# Patient Record
Sex: Female | Born: 1975 | Race: Black or African American | Hispanic: No | Marital: Married | Smoking: Former smoker
Health system: Southern US, Community
[De-identification: ages and names within clinical notes are randomized; demographics above are authoritative.]

## PROBLEM LIST (undated history)

## (undated) DIAGNOSIS — E669 Obesity, unspecified: Secondary | ICD-10-CM

## (undated) DIAGNOSIS — E079 Disorder of thyroid, unspecified: Secondary | ICD-10-CM

## (undated) DIAGNOSIS — I10 Essential (primary) hypertension: Secondary | ICD-10-CM

## (undated) DIAGNOSIS — E785 Hyperlipidemia, unspecified: Secondary | ICD-10-CM

## (undated) DIAGNOSIS — IMO0001 Reserved for inherently not codable concepts without codable children: Secondary | ICD-10-CM

## (undated) DIAGNOSIS — M1990 Unspecified osteoarthritis, unspecified site: Secondary | ICD-10-CM

## (undated) HISTORY — DX: Disorder of thyroid, unspecified: E07.9

## (undated) HISTORY — PX: REDUCTION MAMMAPLASTY: SUR839

## (undated) HISTORY — DX: Obesity, unspecified: E66.9

## (undated) HISTORY — DX: Reserved for inherently not codable concepts without codable children: IMO0001

## (undated) HISTORY — DX: Essential (primary) hypertension: I10

## (undated) HISTORY — DX: Unspecified osteoarthritis, unspecified site: M19.90

## (undated) HISTORY — DX: Hyperlipidemia, unspecified: E78.5

## (undated) HISTORY — PX: COSMETIC SURGERY: SHX468

---

## 2001-08-06 ENCOUNTER — Ambulatory Visit (HOSPITAL_BASED_OUTPATIENT_CLINIC_OR_DEPARTMENT_OTHER): Admission: RE | Admit: 2001-08-06 | Discharge: 2001-08-06 | Payer: Self-pay | Admitting: Plastic Surgery

## 2005-11-20 ENCOUNTER — Emergency Department: Payer: Self-pay | Admitting: Emergency Medicine

## 2005-11-20 ENCOUNTER — Other Ambulatory Visit: Payer: Self-pay

## 2006-11-26 ENCOUNTER — Ambulatory Visit: Payer: Self-pay | Admitting: Obstetrics and Gynecology

## 2007-09-06 ENCOUNTER — Ambulatory Visit: Payer: Self-pay | Admitting: Obstetrics and Gynecology

## 2008-06-18 ENCOUNTER — Ambulatory Visit: Payer: Self-pay

## 2008-12-05 ENCOUNTER — Emergency Department: Payer: Self-pay | Admitting: Emergency Medicine

## 2009-01-27 ENCOUNTER — Ambulatory Visit: Payer: Self-pay | Admitting: Family Medicine

## 2015-08-16 ENCOUNTER — Encounter: Payer: Self-pay | Admitting: Family Medicine

## 2015-08-16 ENCOUNTER — Ambulatory Visit (INDEPENDENT_AMBULATORY_CARE_PROVIDER_SITE_OTHER): Payer: 59 | Admitting: Family Medicine

## 2015-08-16 VITALS — BP 126/82 | HR 95 | Temp 99.0°F | Resp 18 | Ht 63.0 in | Wt 206.9 lb

## 2015-08-16 DIAGNOSIS — M79603 Pain in arm, unspecified: Secondary | ICD-10-CM | POA: Insufficient documentation

## 2015-08-16 DIAGNOSIS — IMO0001 Reserved for inherently not codable concepts without codable children: Secondary | ICD-10-CM

## 2015-08-16 DIAGNOSIS — I10 Essential (primary) hypertension: Secondary | ICD-10-CM | POA: Insufficient documentation

## 2015-08-16 DIAGNOSIS — E785 Hyperlipidemia, unspecified: Secondary | ICD-10-CM | POA: Insufficient documentation

## 2015-08-16 DIAGNOSIS — R609 Edema, unspecified: Secondary | ICD-10-CM | POA: Insufficient documentation

## 2015-08-16 DIAGNOSIS — R7303 Prediabetes: Secondary | ICD-10-CM | POA: Insufficient documentation

## 2015-08-16 MED ORDER — HYDROCHLOROTHIAZIDE 25 MG PO TABS: 25.0000 mg | ORAL_TABLET | Freq: Every day | ORAL | Status: DC

## 2015-08-16 NOTE — Progress Notes (Signed)
Name: Alexis Moody   MRN: [id number]    DOB: [birth year]   Date:08/16/2015       Progress Note  Subjective  Chief Complaint  Chief Complaint  Patient presents with  . Weight Loss    patient wants to talk about doing the Belviq along with 2 smoothies per day and a reasonable meal.  . Hypertension    patient stated that her BP has been good.  . Medication Refill    HCTZ & Belviq    HPI  Patient is here for routine follow up of Hypertension. First diagnosed with hypertension several years ago. Current anti-hypertension medication regimen includes dietary modification, weight management and HCTZ 15 mg a day. Using Belviq for weight management with no side effects although has not lost weight. Max weight current weight 206 lbs.  Patient is following physician recommended management. Not checking blood pressure outside of physician office.  Associated symptoms do not include headache, dizziness, nausea, lower extremity swelling, shortness of breath, chest pain, numbness.   Past Medical History  Diagnosis Date  . Hypertension   . Hyperlipidemia   . Obesity, Class II, BMI 35-39.9, with comorbidity (HCC)   . Obesity     Past Surgical History  Procedure Laterality Date  . Breast enhancement surgery  2000-2001    Family History  Problem Relation Age of Onset  . Hypertension Paternal Grandfather   . Hyperlipidemia Mother   . Hypertension Mother   . Hyperlipidemia Father   . Hypertension Father     Social History   Social History  . Marital Status: Married    Spouse Name: N/A  . Number of Children: N/A  . Years of Education: N/A   Occupational History  . Not on file.   Social History Main Topics  . Smoking status: Never Smoker   . Smokeless tobacco: Not on file  . Alcohol Use: 0.0 oz/week    0 Standard drinks or equivalent per week     Comment: occasional  . Drug Use: No  . Sexual Activity:    Partners: Male   Other Topics Concern  . Not on file   Social History  Narrative  . No narrative on file     Current outpatient prescriptions:  .  Cholecalciferol (VITAMIN D) 2000 UNITS CAPS, Take by mouth., Disp: , Rfl:  .  hydrochlorothiazide (HYDRODIURIL) 25 MG tablet, Take by mouth., Disp: , Rfl:  .  Lorcaserin HCl (BELVIQ) 10 MG TABS, Take by mouth., Disp: , Rfl:   No Known Allergies   ROS  CONSTITUTIONAL: No significant weight changes, fever, chills, weakness or fatigue.  HEENT:  - Eyes: No visual changes.  - Ears: No auditory changes. No pain.  - Nose: No sneezing, congestion, runny nose. - Throat: No sore throat. No changes in swallowing. SKIN: No rash or itching.  CARDIOVASCULAR: No chest pain, chest pressure or chest discomfort. No palpitations or edema.  RESPIRATORY: No shortness of breath, cough or sputum.  NEUROLOGICAL: No headache, dizziness, syncope, paralysis, ataxia, numbness or tingling in the extremities. No memory changes. No change in bowel or bladder control.  MUSCULOSKELETAL: No joint pain. No muscle pain. ENDOCRINOLOGIC: No reports of sweating, cold or heat intolerance. No polyuria or polydipsia.   Objective  Filed Vitals:   08/16/15 0822  BP: 126/82  Pulse: 95  Temp: 99 F (37.2 C)  TempSrc: Oral  Resp: 18  Height:  (1.6 m)  Weight: 206 lb 14.4 oz (93.849 kg)  SpO2: 96%  Body mass index is 36.66 kg/(m^2).  Physical Exam  Constitutional: Patient is obese and well-nourished. In no distress.  HEENT:  - Head: Normocephalic and atraumatic.  - Ears: Bilateral TMs gray, no erythema or effusion - Nose: Nasal mucosa moist - Mouth/Throat: Oropharynx is clear and moist. No tonsillar hypertrophy or erythema. No post nasal drainage.  - Eyes: Conjunctivae clear, EOM movements normal. PERRLA. No scleral icterus.  Neck: Normal range of motion. Neck supple. No JVD present. Mild uniform thyromegaly present.  Cardiovascular: Normal rate, regular rhythm and normal heart sounds.  No murmur heard.  Pulmonary/Chest: Effort  normal and breath sounds normal. No respiratory distress. Musculoskeletal: Normal range of motion bilateral UE and LE, no joint effusions. Peripheral vascular: Bilateral LE no edema. Psychiatric: Patient has a normal mood and affect. Behavior is normal in office today. Judgment and thought content normal in office today.   Assessment & Plan  1. Hypertension goal BP (blood pressure) < 140/90 Well controlled.  - hydrochlorothiazide (HYDRODIURIL) 25 MG tablet; Take 1 tablet (25 mg total) by mouth daily.  Dispense: 30 tablet; Refill: 7  2. Obesity, Class II, BMI 35-39.9, with comorbidity (HCC) Work form filled out as she did not meet their BMI goals. Plan on smoothie and juicing meal replacement 2 meals out of the day with the 3rd meal being a balanced low carb low fat meal. Daily exercise. May continue Belviq. The patient has been counseled on their higher than normal BMI.  They have verbally expressed understanding their increased risk for other diseases.  In efforts to meet a better target BMI goal the patient has been counseled on lifestyle, diet and exercise modification tactics. Start with moderate intensity aerobic exercise (walking, jogging, elliptical, swimming, group or individual sports, hiking) at least a day at least 4 days a week and increase intensity, duration, frequency as tolerated. Diet should include well balance fresh fruits and vegetables avoiding processed foods, carbohydrates and sugars. Drink at least 8oz 10 glasses a day avoiding sodas, sugary fruit drinks, sweetened tea. Check weight on a reliable scale daily and monitor weight loss progress daily. Consider investing in mobile phone apps that will help keep track of weight loss goals.

## 2015-11-22 ENCOUNTER — Ambulatory Visit: Payer: 59 | Admitting: Family Medicine

## 2016-04-13 ENCOUNTER — Encounter: Payer: Self-pay | Admitting: Family Medicine

## 2016-04-13 ENCOUNTER — Ambulatory Visit (INDEPENDENT_AMBULATORY_CARE_PROVIDER_SITE_OTHER): Payer: 59 | Admitting: Family Medicine

## 2016-04-13 VITALS — BP 122/78 | HR 96 | Temp 98.2°F | Resp 18 | Ht 63.0 in | Wt 212.2 lb

## 2016-04-13 DIAGNOSIS — R5383 Other fatigue: Secondary | ICD-10-CM

## 2016-04-13 DIAGNOSIS — L659 Nonscarring hair loss, unspecified: Secondary | ICD-10-CM

## 2016-04-13 DIAGNOSIS — E669 Obesity, unspecified: Secondary | ICD-10-CM

## 2016-04-13 DIAGNOSIS — I10 Essential (primary) hypertension: Secondary | ICD-10-CM

## 2016-04-13 DIAGNOSIS — R7303 Prediabetes: Secondary | ICD-10-CM

## 2016-04-13 DIAGNOSIS — E785 Hyperlipidemia, unspecified: Secondary | ICD-10-CM | POA: Diagnosis not present

## 2016-04-13 DIAGNOSIS — Z0000 Encounter for general adult medical examination without abnormal findings: Secondary | ICD-10-CM | POA: Diagnosis not present

## 2016-04-13 MED ORDER — HYDROCHLOROTHIAZIDE 25 MG PO TABS: 25.0000 mg | ORAL_TABLET | Freq: Every day | ORAL | Status: DC

## 2016-04-13 NOTE — Assessment & Plan Note (Signed)
Recheck fasting; limit saturated fats, weight loss

## 2016-04-13 NOTE — Assessment & Plan Note (Signed)
Check A1c and glucose next week fasting

## 2016-04-13 NOTE — Progress Notes (Signed)
BP 122/78 mmHg  Pulse 96  Temp(Src) 98.2 F (36.8 C)  Resp 18  Ht  (1.6 m)  Wt 212 lb 4 oz (96.276 kg)  BMI 37.61 kg/m2  SpO2 96%  LMP 03/26/2016 (Exact Date)   Subjective:    Patient ID: Alexis Moody, female    DOB: 1976/03/25, 40 y.o.   MRN: [id number]  HPI: Alexis Moody is a 40 y.o. female  Chief Complaint  Patient presents with  . Hypertension    medication refills  . Leg Pain    both legs   Patient is new to me today; her previous provider left the practice Both legs have been bothering her at times Five to ten seconds duration Not like a cramp; but sharp pain; hard to explain Different places, medial left thigh; behind knee, right thigh; no back pain Legs don't feel heavy No B/B dysfunction No old injuries to back or hip injuries Not a good water drinker, but will start  Has put on weight; desk job; sits a lot, pretty sedentary; pretty comfortable Had lost weight and got to decrease BP medicine  Has had high cholesterol in the past  Depression screen PHQ 2/9 08/16/2015  Decreased Interest 0  Down, Depressed, Hopeless 0  PHQ - 2 Score 0   Relevant past medical, surgical, family and social history reviewed Past Medical History  Diagnosis Date  . Hypertension   . Hyperlipidemia   . Obesity, Class II, BMI 35-39.9, with comorbidity (HCC)   . Obesity   remote smoker  Past Surgical History  Procedure Laterality Date  . Breast enhancement surgery  2000-2001   Family History  Problem Relation Age of Onset  . Hyperlipidemia Mother   . Hypertension Mother   . Hyperlipidemia Father   . Hypertension Father   . Diabetes Father   . Hypertension Maternal Grandfather   . Stroke Neg Hx   . Heart disease Neg Hx   mother has thin hair  Social History  Substance Use Topics  . Smoking status: Former Games developer  . Smokeless tobacco: None  . Alcohol Use: 0.0 oz/week    0 Standard drinks or equivalent per week     Comment: occasional   Interim medical  history since last visit reviewed. Allergies and medications reviewed  Review of Systems Per HPI unless specifically indicated above     Objective:    BP 122/78 mmHg  Pulse 96  Temp(Src) 98.2 F (36.8 C)  Resp 18  Ht  (1.6 m)  Wt 212 lb 4 oz (96.276 kg)  BMI 37.61 kg/m2  SpO2 96%  LMP 03/26/2016 (Exact Date)  Wt Readings from Last 3 Encounters:  04/13/16 212 lb 4 oz (96.276 kg)  08/16/15 206 lb 14.4 oz (93.849 kg)    Physical Exam  Constitutional: She appears well-developed and well-nourished. No distress.  obese  HENT:  Head: Normocephalic and atraumatic.  Eyes: EOM are normal. No scleral icterus.  Neck: No thyromegaly present.  Cardiovascular: Normal rate, regular rhythm and normal heart sounds.   No murmur heard. Pulmonary/Chest: Effort normal and breath sounds normal. No respiratory distress. She has no wheezes.  Abdominal: Soft. Bowel sounds are normal. She exhibits no distension.  Musculoskeletal: Normal range of motion. She exhibits no edema.  Neurological: She is alert. She exhibits normal muscle tone.  Skin: Skin is warm and dry. She is not diaphoretic. No pallor.  Psychiatric: She has a normal mood and affect.      Assessment & Plan:  Problem List Items Addressed This Visit      Cardiovascular and Mediastinum   Hypertension goal BP (blood pressure) < 140/90 - Primary    Try DASH guidelines; weight loss encouraged      Relevant Medications   hydrochlorothiazide (HYDRODIURIL) 25 MG tablet     Musculoskeletal and Integument   Hair loss    May be telogen effluvium, but will check CBC and TSH        Other   Fatigue   Relevant Orders   VITAMIN D 25 Hydroxy (Vit-D Deficiency, Fractures) (Completed)   Magnesium (Completed)   HLD (hyperlipidemia)    Recheck fasting; limit saturated fats, weight loss      Relevant Medications   hydrochlorothiazide (HYDRODIURIL) 25 MG tablet   Obesity    Encouraged weigh tloss; see AVS for tips       Pre-diabetes    Check A1c and glucose next week fasting      Relevant Orders   Hgb A1c w/o eAG (Completed)    Other Visit Diagnoses    Preventative health care        Relevant Orders    CBC with Differential/Platelet (Completed)    Comprehensive metabolic panel (Completed)    TSH (Completed)    Lipid Panel w/o Chol/HDL Ratio (Completed)       Follow up plan: Return in about 6 months (around 10/13/2016) for fasting labs and visit with Dr. Sherie DonLada.  An after-visit summary was printed and given to the patient at check-out.  Please see the patient instructions which may contain other information and recommendations beyond what is mentioned above in the assessment and plan.  Meds ordered this encounter  Medications  . hydrochlorothiazide (HYDRODIURIL) 25 MG tablet    Sig: Take 1 tablet (25 mg total) by mouth daily.    Dispense:  30 tablet    Refill:  11   Orders Placed This Encounter  Procedures  . CBC with Differential/Platelet  . Comprehensive metabolic panel  . Hgb A1c w/o eAG  . TSH  . VITAMIN D 25 Hydroxy (Vit-D Deficiency, Fractures)  . Magnesium  . Lipid Panel w/o Chol/HDL Ratio

## 2016-04-13 NOTE — Assessment & Plan Note (Signed)
Try DASH guidelines; weight loss encouraged

## 2016-04-13 NOTE — Patient Instructions (Addendum)
Check out the information at familydoctor.org entitled "Nutrition for Weight Loss: What You Need to Know about Fad Diets" Try to lose between 1-2 pounds per week by taking in fewer calories and burning off more calories You can succeed by limiting portions, limiting foods dense in calories and fat, becoming more active, and drinking 8 glasses of water a day (64 ounces) Don't skip meals, especially breakfast, as skipping meals may alter your metabolism Do not use over-the-counter weight loss pills or gimmicks that claim rapid weight loss A healthy BMI (or body mass index) is between 18.5 and 24.9 You can calculate your ideal BMI at the NIH website JobEconomics.huhttp://www.nhlbi.nih.gov/health/educational/lose_wt/BMI/bmicalc.htm  Try to limit saturated fats in your diet (bologna, hot dogs, barbeque, cheeseburgers, hamburgers, steak, bacon, sausage, cheese, etc.) and get more fresh fruits, vegetables, and whole grains  Your goal blood pressure is less than 140 mmHg on top and under 90 on the bottom. Try to follow the DASH guidelines (DASH stands for Dietary Approaches to Stop Hypertension) Try to limit the sodium in your diet.  Ideally, consume less than 1.5 grams (less than 1,500mg ) per day. Do not add salt when cooking or at the table.  Check the sodium amount on labels when shopping, and choose items lower in sodium when given a choice. Avoid or limit foods that already contain a lot of sodium. Eat a diet rich in fruits and vegetables and whole grains.  DASH Eating Plan DASH stands for "Dietary Approaches to Stop Hypertension." The DASH eating plan is a healthy eating plan that has been shown to reduce high blood pressure (hypertension). Additional health benefits may include reducing the risk of type 2 diabetes mellitus, heart disease, and stroke. The DASH eating plan may also help with weight loss. WHAT DO I NEED TO KNOW ABOUT THE DASH EATING PLAN? For the DASH eating plan, you will follow these general  guidelines:  Choose foods with a percent daily value for sodium of less than 5% (as listed on the food label).  Use salt-free seasonings or herbs instead of table salt or sea salt.  Check with your health care provider or pharmacist before using salt substitutes.  Eat lower-sodium products, often labeled as "lower sodium" or "no salt added."  Eat fresh foods.  Eat more vegetables, fruits, and low-fat dairy products.  Choose whole grains. Look for the word "whole" as the first word in the ingredient list.  Choose fish and skinless chicken or [place] more often than red meat. Limit fish, poultry, and meat to 6 oz (170 g) each day.  Limit sweets, desserts, sugars, and sugary drinks.  Choose heart-healthy fats.  Limit cheese to 1 oz (28 g) per day.  Eat more home-cooked food and less restaurant, buffet, and fast food.  Limit fried foods.  Cook foods using methods other than frying.  Limit canned vegetables. If you do use them, rinse them well to decrease the sodium.  When eating at a restaurant, ask that your food be prepared with less salt, or no salt if possible. WHAT FOODS CAN I EAT? Seek help from a dietitian for individual calorie needs. Grains Whole grain or whole wheat bread. Brown rice. Whole grain or whole wheat pasta. Quinoa, bulgur, and whole grain cereals. Low-sodium cereals. Corn or whole wheat flour tortillas. Whole grain cornbread. Whole grain crackers. Low-sodium crackers. Vegetables Fresh or frozen vegetables (raw, steamed, roasted, or grilled). Low-sodium or reduced-sodium tomato and vegetable juices. Low-sodium or reduced-sodium tomato sauce and paste. Low-sodium or reduced-sodium canned  vegetables.  Fruits All fresh, canned (in natural juice), or frozen fruits. Meat and Other Protein Products Ground beef (85% or leaner), grass-fed beef, or beef trimmed of fat. Skinless chicken or [place]. Ground chicken or [place]. Pork trimmed of fat. All fish and seafood.  Eggs. Dried beans, peas, or lentils. Unsalted nuts and seeds. Unsalted canned beans. Dairy Low-fat dairy products, such as skim or 1% milk, 2% or reduced-fat cheeses, low-fat ricotta or cottage cheese, or plain low-fat yogurt. Low-sodium or reduced-sodium cheeses. Fats and Oils Tub margarines without trans fats. Light or reduced-fat mayonnaise and salad dressings (reduced sodium). Avocado. Safflower, olive, or canola oils. Natural peanut or almond butter. Other Unsalted popcorn and pretzels. The items listed above may not be a complete list of recommended foods or beverages. Contact your dietitian for more options. WHAT FOODS ARE NOT RECOMMENDED? Grains White bread. White pasta. White rice. Refined cornbread. Bagels and croissants. Crackers that contain trans fat. Vegetables Creamed or fried vegetables. Vegetables in a cheese sauce. Regular canned vegetables. Regular canned tomato sauce and paste. Regular tomato and vegetable juices. Fruits Dried fruits. Canned fruit in light or heavy syrup. Fruit juice. Meat and Other Protein Products Fatty cuts of meat. Ribs, chicken wings, bacon, sausage, bologna, salami, chitterlings, fatback, hot dogs, bratwurst, and packaged luncheon meats. Salted nuts and seeds. Canned beans with salt. Dairy Whole or 2% milk, cream, half-and-half, and cream cheese. Whole-fat or sweetened yogurt. Full-fat cheeses or blue cheese. Nondairy creamers and whipped toppings. Processed cheese, cheese spreads, or cheese curds. Condiments Onion and garlic salt, seasoned salt, table salt, and sea salt. Canned and packaged gravies. Worcestershire sauce. Tartar sauce. Barbecue sauce. Teriyaki sauce. Soy sauce, including reduced sodium. Steak sauce. Fish sauce. Oyster sauce. Cocktail sauce. Horseradish. Ketchup and mustard. Meat flavorings and tenderizers. Bouillon cubes. Hot sauce. Tabasco sauce. Marinades. Taco seasonings. Relishes. Fats and Oils Butter, stick margarine, lard,  shortening, ghee, and bacon fat. Coconut, palm kernel, or palm oils. Regular salad dressings. Other Pickles and olives. Salted popcorn and pretzels. The items listed above may not be a complete list of foods and beverages to avoid. Contact your dietitian for more information. WHERE CAN I FIND MORE INFORMATION? National Heart, Lung, and Blood Institute: CablePromo.it   This information is not intended to replace advice given to you by your health care provider. Make sure you discuss any questions you have with your health care provider.   Document Released: 10/19/2011 Document Revised: 11/20/2014 Document Reviewed: 09/03/2013 Elsevier Interactive Patient Education [employer]

## 2016-04-18 LAB — VITAMIN D 25 HYDROXY (VIT D DEFICIENCY, FRACTURES): VIT D 25 HYDROXY: 39 ng/mL (ref 30.0–100.0)

## 2016-04-18 LAB — CBC WITH DIFFERENTIAL/PLATELET
BASOS: 0 %
Basophils Absolute: 0 10*3/uL (ref 0.0–0.2)
EOS (ABSOLUTE): 0.3 10*3/uL (ref 0.0–0.4)
EOS: 4 %
Hematocrit: 35.9 % (ref 34.0–46.6)
Hemoglobin: 12.1 g/dL (ref 11.1–15.9)
IMMATURE GRANULOCYTES: 0 %
Immature Grans (Abs): 0 10*3/uL (ref 0.0–0.1)
LYMPHS: 40 %
Lymphocytes Absolute: 2.9 10*3/uL (ref 0.7–3.1)
MCH: 26.2 pg — ABNORMAL LOW (ref 26.6–33.0)
MCHC: 33.7 g/dL (ref 31.5–35.7)
MCV: 78 fL — AB (ref 79–97)
MONOCYTES: 9 %
Monocytes Absolute: 0.6 10*3/uL (ref 0.1–0.9)
NEUTROS ABS: 3.4 10*3/uL (ref 1.4–7.0)
NEUTROS PCT: 47 %
PLATELETS: 215 10*3/uL (ref 150–379)
RBC: 4.62 x10E6/uL (ref 3.77–5.28)
RDW: 15.5 % — AB (ref 12.3–15.4)
WBC: 7.2 10*3/uL (ref 3.4–10.8)

## 2016-04-18 LAB — MAGNESIUM: Magnesium: 1.9 mg/dL (ref 1.6–2.3)

## 2016-04-18 LAB — COMPREHENSIVE METABOLIC PANEL
A/G RATIO: 1.4 (ref 1.2–2.2)
ALT: 11 IU/L (ref 0–32)
AST: 10 IU/L (ref 0–40)
Albumin: 3.7 g/dL (ref 3.5–5.5)
Alkaline Phosphatase: 51 IU/L (ref 39–117)
BILIRUBIN TOTAL: 0.3 mg/dL (ref 0.0–1.2)
BUN/Creatinine Ratio: 14 (ref 9–23)
BUN: 12 mg/dL (ref 6–20)
CALCIUM: 8.9 mg/dL (ref 8.7–10.2)
CHLORIDE: 101 mmol/L (ref 96–106)
CO2: 23 mmol/L (ref 18–29)
Creatinine, Ser: 0.85 mg/dL (ref 0.57–1.00)
GFR calc Af Amer: 101 mL/min/{1.73_m2} (ref >59)
GFR, EST NON AFRICAN AMERICAN: 87 mL/min/{1.73_m2} (ref >59)
Globulin, Total: 2.7 g/dL (ref 1.5–4.5)
Glucose: 95 mg/dL (ref 65–99)
POTASSIUM: 4 mmol/L (ref 3.5–5.2)
Sodium: 137 mmol/L (ref 134–144)
Total Protein: 6.4 g/dL (ref 6.0–8.5)

## 2016-04-18 LAB — LIPID PANEL W/O CHOL/HDL RATIO
Cholesterol, Total: 192 mg/dL (ref 100–199)
HDL: 50 mg/dL (ref >39)
LDL Calculated: 124 mg/dL — ABNORMAL HIGH (ref 0–99)
Triglycerides: 92 mg/dL (ref 0–149)
VLDL Cholesterol Cal: 18 mg/dL (ref 5–40)

## 2016-04-18 LAB — HGB A1C W/O EAG: Hgb A1c MFr Bld: 6.2 % — ABNORMAL HIGH (ref 4.8–5.6)

## 2016-04-18 LAB — TSH: TSH: 1.16 u[IU]/mL (ref 0.450–4.500)

## 2016-04-21 ENCOUNTER — Other Ambulatory Visit: Payer: Self-pay

## 2016-05-07 NOTE — Assessment & Plan Note (Signed)
Encouraged weigh tloss; see AVS for tips

## 2016-05-07 NOTE — Assessment & Plan Note (Signed)
May be telogen effluvium, but will check CBC and TSH

## 2016-05-17 ENCOUNTER — Telehealth: Payer: Self-pay

## 2016-05-17 DIAGNOSIS — R718 Other abnormality of red blood cells: Secondary | ICD-10-CM

## 2016-05-17 NOTE — Telephone Encounter (Signed)
I apologize; I just entered the lab orders; she does not need to be fasting; the labs will just need to be released/printed when she comes back

## 2016-05-17 NOTE — Telephone Encounter (Signed)
Patient was informed via voicemail that her lab order was ready

## 2016-05-17 NOTE — Telephone Encounter (Signed)
Patient came by to pick up her lab order but there was nothing printed or ordered. She said she will come by next week.

## 2016-05-17 NOTE — Telephone Encounter (Signed)
Order was released and placed up front for when she comes by to have her blood drawn.

## 2016-07-03 ENCOUNTER — Other Ambulatory Visit: Payer: Self-pay | Admitting: Emergency Medicine

## 2016-07-03 DIAGNOSIS — R718 Other abnormality of red blood cells: Secondary | ICD-10-CM

## 2016-07-04 LAB — CBC WITH DIFFERENTIAL/PLATELET
BASOS: 0 %
Basophils Absolute: 0 10*3/uL (ref 0.0–0.2)
EOS (ABSOLUTE): 0.2 10*3/uL (ref 0.0–0.4)
Eos: 4 %
HEMOGLOBIN: 11.8 g/dL (ref 11.1–15.9)
Hematocrit: 35.5 % (ref 34.0–46.6)
IMMATURE GRANS (ABS): 0 10*3/uL (ref 0.0–0.1)
Immature Granulocytes: 0 %
LYMPHS: 38 %
Lymphocytes Absolute: 2.6 10*3/uL (ref 0.7–3.1)
MCH: 25.9 pg — AB (ref 26.6–33.0)
MCHC: 33.2 g/dL (ref 31.5–35.7)
MCV: 78 fL — AB (ref 79–97)
MONOCYTES: 9 %
Monocytes Absolute: 0.6 10*3/uL (ref 0.1–0.9)
NEUTROS ABS: 3.2 10*3/uL (ref 1.4–7.0)
Neutrophils: 49 %
Platelets: 235 10*3/uL (ref 150–379)
RBC: 4.56 x10E6/uL (ref 3.77–5.28)
RDW: 15.4 % (ref 12.3–15.4)
WBC: 6.7 10*3/uL (ref 3.4–10.8)

## 2016-07-04 LAB — FERRITIN: Ferritin: 42 ng/mL (ref 15–150)

## 2016-07-20 ENCOUNTER — Other Ambulatory Visit: Payer: Self-pay | Admitting: Family Medicine

## 2016-07-20 ENCOUNTER — Encounter: Payer: Self-pay | Admitting: Family Medicine

## 2016-07-20 DIAGNOSIS — R718 Other abnormality of red blood cells: Secondary | ICD-10-CM

## 2016-07-20 NOTE — Assessment & Plan Note (Signed)
Check cbc in one month; stool cards x 3 ordered

## 2016-07-20 NOTE — Progress Notes (Signed)
Recheck CBC early October; do stool cards x 3

## 2016-08-21 ENCOUNTER — Ambulatory Visit (INDEPENDENT_AMBULATORY_CARE_PROVIDER_SITE_OTHER): Payer: 59 | Admitting: Family Medicine

## 2016-08-21 ENCOUNTER — Encounter: Payer: Self-pay | Admitting: Family Medicine

## 2016-08-21 VITALS — BP 128/74 | HR 90 | Temp 99.2°F | Resp 16 | Ht 63.0 in | Wt 217.2 lb

## 2016-08-21 DIAGNOSIS — R7303 Prediabetes: Secondary | ICD-10-CM

## 2016-08-21 DIAGNOSIS — R718 Other abnormality of red blood cells: Secondary | ICD-10-CM

## 2016-08-21 DIAGNOSIS — Z6838 Body mass index (BMI) 38.0-38.9, adult: Secondary | ICD-10-CM

## 2016-08-21 DIAGNOSIS — E6609 Other obesity due to excess calories: Secondary | ICD-10-CM

## 2016-08-21 NOTE — Assessment & Plan Note (Signed)
Weight loss will help lessen chance of progression to outright diabetes; check A1c at next visit in 3 months

## 2016-08-21 NOTE — Assessment & Plan Note (Signed)
Went through the BMI calculator with her; height checked by MD, 63 inches today without shoes; calculated 169 pounds to get BMI under 30; will take her 48 weeks if she is able to lose 1 pound a week; form completed for [facility]; hydration, gradually build up activity, limit calories

## 2016-08-21 NOTE — Progress Notes (Signed)
BP 128/74 (BP Location: Left Arm, Patient Position: Sitting, Cuff Size: Large)   Pulse 90   Temp 99.2 F (37.3 C) (Oral)   Resp 16   Ht 5\' 3"  (1.6 m)   Wt 217 lb 3 oz (98.5 kg)   LMP 08/16/2016   SpO2 95%   BMI 38.47 kg/m    Subjective:    Patient ID: Alexis Moody, female    DOB: [birth year], 40 y.o.   MRN: [id number]  HPI: Alexis Moody is a 40 y.o. female  Chief Complaint  Patient presents with  . Follow-up    Previous blood work questions   . Follow-up    [facility] screening need sign    Prediabetes; no sugary drinks; not enough water, maybe 32 ounces a day Obesity; she has actually gained weight since last visit She weighed about 120 pounds at high school graduation; 140 pounds after first child; on Depo Weight gain has been slow and steady and gradually Able to lose when exercising and eating better Not much grocery shopping and just grabs something She is unable to exercise because she gets out of breath; now it's her weight; stomach is getting bigger; just not in shape She has a form to complete for [facility] She is interested in smoothies to see if those might help with weight loss; she does not take ACE-I or ARB  She had mild microcytosis; normal ferritin She had hard stool with a lot of blood about a year ago but that stopped, and thinks something tore; no problems with that since that; no rectal pain; no blood in stool; she has not done the stool cards yet or had the repeat CBC done; she is still taking iron a few days a week  Depression screen [facility] 2/9 08/21/2016 08/16/2015  Decreased Interest 0 0  Down, Depressed, Hopeless 0 0  PHQ - 2 Score 0 0   Relevant past medical, surgical, family and social history reviewed Past Medical History:  Diagnosis Date  . Hyperlipidemia   . Hypertension   . Obesity   . Obesity, Class II, BMI 35-39.9, with comorbidity    Past Surgical History:  Procedure Laterality Date  . BREAST ENHANCEMENT SURGERY  2000-2001   Family  History  Problem Relation Age of Onset  . Hyperlipidemia Mother   . Hypertension Mother   . Hyperlipidemia Father   . Hypertension Father   . Diabetes Father   . Hypertension Maternal Grandfather   . Stroke Neg Hx   . Heart disease Neg Hx    Social History  Substance Use Topics  . Smoking status: Former Games developer  . Smokeless tobacco: Not on file  . Alcohol use 0.0 oz/week     Comment: occasional   Interim medical history since last visit reviewed. Allergies and medications reviewed  Review of Systems Per HPI unless specifically indicated above     Objective:    BP 128/74 (BP Location: Left Arm, Patient Position: Sitting, Cuff Size: Large)   Pulse 90   Temp 99.2 F (37.3 C) (Oral)   Resp 16   Ht 5\' 3"  (1.6 m)   Wt 217 lb 3 oz (98.5 kg)   LMP 08/16/2016   SpO2 95%   BMI 38.47 kg/m   Wt Readings from Last 3 Encounters:  08/21/16 217 lb 3 oz (98.5 kg)  04/13/16 212 lb 4 oz (96.3 kg)  08/16/15 206 lb 14.4 oz (93.8 kg)    Physical Exam  Constitutional: She appears well-developed and well-nourished.  No distress.  Cardiovascular: Normal rate and regular rhythm.   Pulmonary/Chest: Effort normal and breath sounds normal. She has no wheezes.  Abdominal: She exhibits no distension.  Skin: No pallor.  Psychiatric: She has a normal mood and affect. Her behavior is normal. Judgment and thought content normal.   Results for orders placed or performed in visit on 07/03/16  Ferritin  Result Value Ref Range   Ferritin 42 15 - 150 ng/mL  CBC w/Diff/Platelet  Result Value Ref Range   WBC 6.7 3.4 - 10.8 x10E3/uL   RBC 4.56 3.77 - 5.28 x10E6/uL   Hemoglobin 11.8 11.1 - 15.9 g/dL   Hematocrit 16.135.5 09.634.0 - 46.6 %   MCV 78 (L) 79 - 97 fL   MCH 25.9 (L) 26.6 - 33.0 pg   MCHC 33.2 31.5 - 35.7 g/dL   RDW 04.515.4 40.912.3 - 81.115.4 %   Platelets 235 150 - 379 x10E3/uL   Neutrophils 49 %   Lymphs 38 %   Monocytes 9 %   Eos 4 %   Basos 0 %   Neutrophils Absolute 3.2 1.4 - 7.0 x10E3/uL    Lymphocytes Absolute 2.6 0.7 - 3.1 x10E3/uL   Monocytes Absolute 0.6 0.1 - 0.9 x10E3/uL   EOS (ABSOLUTE) 0.2 0.0 - 0.4 x10E3/uL   Basophils Absolute 0.0 0.0 - 0.2 x10E3/uL   Immature Granulocytes 0 %   Immature Grans (Abs) 0.0 0.0 - 0.1 x10E3/uL      Assessment & Plan:   Problem List Items Addressed This Visit      Other   Pre-diabetes (Chronic)    Weight loss will help lessen chance of progression to outright diabetes; check A1c at next visit in 3 months      Obesity (Chronic)    Went through the BMI calculator with her; height checked by MD, 63 inches today without shoes; calculated 169 pounds to get BMI under 30; will take her 48 weeks if she is able to lose 1 pound a week; form completed for [facility]; hydration, gradually build up activity, limit calories      Microcytosis    Check labs; stool cards given to be returned soon      Relevant Orders   CBC with Differential/Platelet   Hemoglobinopathy evaluation   Ferritin   Iron and TIBC    Other Visit Diagnoses   None.      Follow up plan: Return in about 3 months (around 11/21/2016) for weight check, prediabetes.  An after-visit summary was printed and given to the patient at check-out.  Please see the patient instructions which may contain other information and recommendations beyond what is mentioned above in the assessment and plan.  No orders of the defined types were placed in this encounter.   Orders Placed This Encounter  Procedures  . CBC with Differential/Platelet  . Hemoglobinopathy evaluation  . Ferritin  . Iron and TIBC

## 2016-08-21 NOTE — Patient Instructions (Signed)
Check out the information at familydoctor.org entitled "Nutrition for Weight Loss: What You Need to Know about Fad Diets" Try to lose between 1-2 pounds per week by taking in fewer calories and burning off more calories You can succeed by limiting portions, limiting foods dense in calories and fat, becoming more active, and drinking 8 glasses of water a day (64 ounces) Don't skip meals, especially breakfast, as skipping meals may alter your metabolism Do not use over-the-counter weight loss pills or gimmicks that claim rapid weight loss A healthy BMI (or body mass index) is between 18.5 and 24.9 You can calculate your ideal BMI at the NIH website JobE[employer].huhttp://www.nhlbi.nih.gov/health/educational/lose_wt/BMI/bmicalc.htm  Return the stool cards at your earliest convenience

## 2016-08-21 NOTE — Assessment & Plan Note (Signed)
Check labs; stool cards given to be returned soon

## 2016-08-22 LAB — CBC WITH DIFFERENTIAL/PLATELET
BASOS ABS: 0 10*3/uL (ref 0.0–0.2)
BASOS: 0 %
EOS (ABSOLUTE): 0.3 10*3/uL (ref 0.0–0.4)
Eos: 4 %
HEMOGLOBIN: 12.2 g/dL (ref 11.1–15.9)
Hematocrit: 35.8 % (ref 34.0–46.6)
IMMATURE GRANS (ABS): 0 10*3/uL (ref 0.0–0.1)
IMMATURE GRANULOCYTES: 0 %
LYMPHS: 41 %
Lymphocytes Absolute: 3.1 10*3/uL (ref 0.7–3.1)
MCH: 26.6 pg (ref 26.6–33.0)
MCHC: 34.1 g/dL (ref 31.5–35.7)
MCV: 78 fL — AB (ref 79–97)
MONOCYTES: 8 %
Monocytes Absolute: 0.6 10*3/uL (ref 0.1–0.9)
NEUTROS ABS: 3.6 10*3/uL (ref 1.4–7.0)
NEUTROS PCT: 47 %
Platelets: 231 10*3/uL (ref 150–379)
RBC: 4.58 x10E6/uL (ref 3.77–5.28)
RDW: 16 % — ABNORMAL HIGH (ref 12.3–15.4)
WBC: 7.6 10*3/uL (ref 3.4–10.8)

## 2016-08-22 LAB — IRON AND TIBC
IRON SATURATION: 19 % (ref 15–55)
Iron: 69 ug/dL (ref 27–159)
TIBC: 370 ug/dL (ref 250–450)
UIBC: 301 ug/dL (ref 131–425)

## 2016-08-22 LAB — HEMOGLOBINOPATHY EVALUATION
HEMOGLOBIN A2 QUANTITATION: 2.3 % (ref 0.7–3.1)
HEMOGLOBIN F QUANTITATION: 0 % (ref 0.0–2.0)
HGB C: 0 %
HGB S: 0 %
Hgb A: 97.7 % (ref 94.0–98.0)

## 2016-08-22 LAB — FERRITIN: Ferritin: 45 ng/mL (ref 15–150)

## 2016-08-23 ENCOUNTER — Other Ambulatory Visit: Payer: Self-pay

## 2016-08-23 DIAGNOSIS — R718 Other abnormality of red blood cells: Secondary | ICD-10-CM

## 2016-10-16 ENCOUNTER — Ambulatory Visit: Payer: 59 | Admitting: Family Medicine

## 2016-10-18 ENCOUNTER — Emergency Department
Admission: EM | Admit: 2016-10-18 | Discharge: 2016-10-18 | Disposition: A | Discharge status: Home / Self Care | Payer: 59 | Attending: Student in an Organized Health Care Education/Training Program | Admitting: Student in an Organized Health Care Education/Training Program

## 2016-10-18 DIAGNOSIS — M79604 Pain in right leg: Secondary | ICD-10-CM | POA: Diagnosis not present

## 2016-10-18 DIAGNOSIS — M25512 Pain in left shoulder: Secondary | ICD-10-CM | POA: Diagnosis not present

## 2016-10-18 DIAGNOSIS — Z79899 Other long term (current) drug therapy: Secondary | ICD-10-CM | POA: Diagnosis not present

## 2016-10-18 DIAGNOSIS — Z87891 Personal history of nicotine dependence: Secondary | ICD-10-CM | POA: Insufficient documentation

## 2016-10-18 DIAGNOSIS — I10 Essential (primary) hypertension: Secondary | ICD-10-CM | POA: Diagnosis not present

## 2016-10-18 MED ORDER — KETOROLAC TROMETHAMINE 60 MG/2ML IM SOLN
60.0000 mg | Freq: Once | INTRAMUSCULAR | Status: AC
  Administered 2016-10-18: 60 mg via INTRAMUSCULAR
  Filled 2016-10-18: qty 2

## 2016-10-18 MED ORDER — NAPROXEN 500 MG PO TBEC: 500.0000 mg | DELAYED_RELEASE_TABLET | Freq: Two times a day (BID) | ORAL | 0 refills | Status: AC

## 2016-10-18 NOTE — ED Triage Notes (Signed)
Pt presents to ED with c/o RIGHT leg pain that started over a week ago, and RIGHT arm and shoulder pain that started yesterday. Pt reports being seen mid-summer by her PCP d/t left leg pain and lab tests showed nothing acute or any abnormalities. Pt denies any change in bowel or bladder habits, denies N/V/D.  Pt is non-smoker and not currently taking BC.

## 2016-10-18 NOTE — ED Provider Notes (Signed)
[facility] Emergency Department Provider Note ____________________________________________  Time seen: Approximately 9:05 PM  I have reviewed the triage vital signs and the nursing notes.   HISTORY  Chief Complaint Leg Pain and Shoulder Pain    HPI Marcelle OverlieQiana Unrein is a 40 y.o. female presenting with acute non-specifc 3/10 aching of the left upper extremity for one day. Patient states that she had an aching right shoulder yesterday but it has since dissipated. Patient denies neck pain or incidences of trauma. Patient has not been lifting excessively. Patient states that she would ordinarily have to be at work at 1:00 am, and would like to be "written out" to rest. She denies fever or recent illness. Upon sending her significant other out of the room, I asked if patient felt safe. Patient states that she feels safe and is in a supportive relationship. Denies excessive fatigue, fever or recent illness. Has been otherwise well. No history of thromboembolism, recent travel or surgery.  Past Medical History:  Diagnosis Date  . Hyperlipidemia   . Hypertension   . Obesity   . Obesity, Class II, BMI 35-39.9, with comorbidity     Patient Active Problem List   Diagnosis Date Noted  . Microcytosis 05/17/2016  . Obesity 04/13/2016  . Fatigue 04/13/2016  . Hair loss 04/13/2016  . Arm pain 08/16/2015  . Dependent edema 08/16/2015  . Hypertension goal BP (blood pressure) < 140/90 08/16/2015  . HLD (hyperlipidemia) 08/16/2015  . Pre-diabetes 08/16/2015    Past Surgical History:  Procedure Laterality Date  . BREAST ENHANCEMENT SURGERY  2000-2001    Prior to Admission medications   Medication Sig Start Date End Date Taking? Authorizing Provider  ferrous sulfate 325 (65 FE) MG tablet Take 325 mg by mouth 3 (three) times a week.   Yes Historical Provider, MD  Cholecalciferol (VITAMIN D) 2000 UNITS CAPS Take by mouth.    Historical Provider, MD  hydrochlorothiazide  (HYDRODIURIL) 25 MG tablet Take 1 tablet (25 mg total) by mouth daily. 04/13/16   Kerman PasseyMelinda P Lada, MD  naproxen (EC NAPROSYN) 500 MG EC tablet Take 1 tablet (500 mg total) by mouth 2 (two) times daily with a meal. 10/18/16 10/18/17  Orvil FeilJaclyn M Zolton Dowson, PA-C    Allergies Patient has no known allergies.  Family History  Problem Relation Age of Onset  . Hyperlipidemia Mother   . Hypertension Mother   . Hyperlipidemia Father   . Hypertension Father   . Diabetes Father   . Hypertension Maternal Grandfather   . Stroke Neg Hx   . Heart disease Neg Hx     Social History Social History  Substance Use Topics  . Smoking status: Former Games developermoker  . Smokeless tobacco: Never Used  . Alcohol use 0.0 oz/week     Comment: occasional    Review of Systems Constitutional: No recent illness. Cardiovascular: Denies chest pain or palpitations. Respiratory: Denies shortness of breath. Musculoskeletal: Pain in left shoulder.  Skin: Negative for rash, wound, lesion. Neurological: Negative for focal weakness or numbness.  ____________________________________________   PHYSICAL EXAM:  VITAL SIGNS: ED Triage Vitals  Enc Vitals Group     BP 10/18/16 2053 134/87     Pulse Rate 10/18/16 2053 90     Resp 10/18/16 2053 16     Temp 10/18/16 2053 98.1 F (36.7 C)     Temp Source 10/18/16 2053 Oral     SpO2 10/18/16 2053 99 %     Weight 10/18/16 2055 215 lb (97.5 kg)  Height 10/18/16 2055 5\' 1"  (1.549 m)     Head Circumference --      Peak Flow --      Pain Score 10/18/16 2055 7     Pain Loc --      Pain Edu? --      Excl. in GC? --     Constitutional: Alert and oriented. Well appearing and in no acute distress. Eyes: Conjunctivae are normal. EOMI. Head: Atraumatic. Neck: No stridor.  Respiratory: Normal respiratory effort.   Musculoskeletal: She has full range of motion of the right and left upper extremities. She has 5 out of 5 strength of the upper extremities bilaterally. Reflexes are 2+ and  symmetrical. Palpable radial and ulnar pulses bilaterally. Neurologic: Normal speech and language. No gross focal neurologic deficits are appreciated. Cranial nerves: 2-10 normal as tested. Cerebellar: Finger-nose-finger WNL, heel to shin WNL. Sensorimotor: No pronator drift, clonus, sensory loss or abnormal reflexes. Vision: No visual field deficts noted to confrontation.  Speech: No dysarthria or expressive aphasia.  Skin:  Skin is warm, dry and intact. Atraumatic. Psychiatric: Mood and affect are normal. Speech and behavior are normal.  ____________________________________________   LABS (all labs ordered are listed, but only abnormal results are displayed)  Labs Reviewed - No data to display   PROCEDURES  Procedure(s) performed: Toradol    ____________________________________________   INITIAL IMPRESSION / ASSESSMENT AND PLAN / ED COURSE  Clinical Course     Pertinent labs & imaging results that were available during my care of the patient were reviewed by me and considered in my medical decision making (see chart for details).  Assessment and plan: Acute, non-specific pain of left shoulder.  Patient has acute nonspecific pain of left shoulder without preceding injury. She denies pain with range of motion testing. Patient explicitly requests to be written out of work for tonight. She was given an injection of Toradol in the emergency department. She was prescribed naproxen to be used as needed for pain and inflammation. All patient questions were answered. ____________________________________________   FINAL CLINICAL IMPRESSION(S) / ED DIAGNOSES  Final diagnoses:  Acute pain of left shoulder       Orvil FeilJaclyn M Shante Archambeault, PA-C 10/18/16 2138    Willy EddyPatrick Robinson, MD 10/18/16 2204

## 2016-11-27 ENCOUNTER — Ambulatory Visit: Payer: 59 | Admitting: Family Medicine

## 2017-04-13 ENCOUNTER — Other Ambulatory Visit: Payer: Self-pay | Admitting: Family Medicine

## 2017-04-13 DIAGNOSIS — I10 Essential (primary) hypertension: Secondary | ICD-10-CM

## 2017-04-13 NOTE — Telephone Encounter (Signed)
Rx approved, but please ask patient to schedule an appt to be seen in the next few weeks; thank you

## 2017-04-16 NOTE — Telephone Encounter (Signed)
Mailbox is full and cannot except messages.

## 2017-05-13 ENCOUNTER — Other Ambulatory Visit: Payer: Self-pay | Admitting: Family Medicine

## 2017-05-13 DIAGNOSIS — I10 Essential (primary) hypertension: Secondary | ICD-10-CM

## 2017-05-14 NOTE — Telephone Encounter (Signed)
Please advise that we already gave a 30-day supply so that patient could schedule a follow up. No further refills can be provided until she comes in for a follow up with PCP Dr. Sherie DonLada. Thank you!

## 2017-05-15 ENCOUNTER — Other Ambulatory Visit: Payer: Self-pay | Admitting: Family Medicine

## 2017-05-15 DIAGNOSIS — I10 Essential (primary) hypertension: Secondary | ICD-10-CM

## 2017-05-15 MED ORDER — HYDROCHLOROTHIAZIDE 25 MG PO TABS: 25.0000 mg | ORAL_TABLET | Freq: Every day | ORAL | 0 refills | Status: DC

## 2017-05-15 NOTE — Telephone Encounter (Signed)
Thank you for having her schedule an appt; Rx approved

## 2017-05-15 NOTE — Telephone Encounter (Signed)
Patient has scheduled an appt.

## 2017-05-15 NOTE — Telephone Encounter (Signed)
Pt [facility] [address]

## 2017-05-15 NOTE — Telephone Encounter (Signed)
Left detailed voicemail to call back to schedule a f/u appt

## 2017-05-24 ENCOUNTER — Encounter: Payer: Self-pay | Admitting: Family Medicine

## 2017-05-24 ENCOUNTER — Ambulatory Visit (INDEPENDENT_AMBULATORY_CARE_PROVIDER_SITE_OTHER): Payer: 59 | Admitting: Family Medicine

## 2017-05-24 VITALS — BP 114/68 | HR 92 | Temp 98.2°F | Resp 16 | Wt 220.3 lb

## 2017-05-24 DIAGNOSIS — E782 Mixed hyperlipidemia: Secondary | ICD-10-CM | POA: Diagnosis not present

## 2017-05-24 DIAGNOSIS — Z5181 Encounter for therapeutic drug level monitoring: Secondary | ICD-10-CM | POA: Diagnosis not present

## 2017-05-24 DIAGNOSIS — L659 Nonscarring hair loss, unspecified: Secondary | ICD-10-CM

## 2017-05-24 DIAGNOSIS — I10 Essential (primary) hypertension: Secondary | ICD-10-CM

## 2017-05-24 DIAGNOSIS — R7303 Prediabetes: Secondary | ICD-10-CM

## 2017-05-24 DIAGNOSIS — R5383 Other fatigue: Secondary | ICD-10-CM

## 2017-05-24 DIAGNOSIS — R718 Other abnormality of red blood cells: Secondary | ICD-10-CM | POA: Diagnosis not present

## 2017-05-24 MED ORDER — HYDROCHLOROTHIAZIDE 25 MG PO TABS: 12.5000 mg | ORAL_TABLET | Freq: Every day | ORAL | 5 refills | Status: DC

## 2017-05-24 NOTE — Assessment & Plan Note (Signed)
Check ferritin and CBC to see if iron still needed; stool cards given

## 2017-05-24 NOTE — Patient Instructions (Addendum)
Contact your insurance company to see if they will cover Saxenda (shot) or Contrave (pills) or Belviq (pill) Check out the information at familydoctor.org entitled "Nutrition for Weight Loss: What You Need to Know about Fad Diets" Try to lose between 1-2 pounds per week by taking in fewer calories and burning off more calories You can succeed by limiting portions, limiting foods dense in calories and fat, becoming more active, and drinking 8 glasses of water a day (64 ounces) Don't skip meals, especially breakfast, as skipping meals may alter your metabolism Do not use over-the-counter weight loss pills or gimmicks that claim rapid weight loss A healthy BMI (or body mass index) is between 18.5 and 24.9 You can calculate your ideal BMI at the NIH website JobE[employer].huhttp://www.nhlbi.nih.gov/health/educational/lose_wt/BMI/bmicalc.htm Try for at least 6,000 steps a day, and up to 10,000 steps a day is amazing Decrease your HCTZ to 12.5 mg daily We'll get labs today If you have not heard anything from my staff in a week about any orders/referrals/studies from today, please contact us here to follow-up [phone]

## 2017-05-24 NOTE — Assessment & Plan Note (Signed)
Check A1c; working on weight loss

## 2017-05-24 NOTE — Assessment & Plan Note (Signed)
Decrease hctz to help with weight loss and prevent dehydration

## 2017-05-24 NOTE — Assessment & Plan Note (Signed)
Check TSH 

## 2017-05-24 NOTE — Progress Notes (Signed)
BP 114/68   Pulse 92   Temp 98.2 F (36.8 C) (Oral)   Resp 16   Wt 220 lb 4.8 oz (99.9 kg)   LMP 05/13/2017   SpO2 96%   BMI 41.63 kg/m    Subjective:    Patient ID: Alexis Moody, female    DOB: 10-Mar-1976, 41 y.o.   MRN: [id number]  HPI: Alexis OverlieQiana Mancebo is a 41 y.o. female  Chief Complaint  Patient presents with  . Medication Refill    bp meds  . Dizziness  . Obesity    HPI She wants to start taking today about her blood pressure medicine; hair is thin  She asked about the keto diet She did try exercising and lost five pounds, was eating right but then fell off Was trying to watch her portions She works 3rd I asked about medicines, but she is scared of the side effects  She is not drinking enough water She went to the ER in December; no more episodes  Prediabetes; last A1c 6.2; father has diabetes; no nocturia, no blurred vision, no dry mouth Sees gyn for well woman exams  [facility] 05/24/2017 08/21/2016 08/16/2015  Decreased Interest 0 0 0  Down, Depressed, Hopeless 0 0 0  PHQ - 2 Score 0 0 0    Relevant past medical, surgical, family and social history reviewed Past Medical History:  Diagnosis Date  . Hyperlipidemia   . Hypertension   . Obesity   . Obesity, Class II, BMI 35-39.9, with comorbidity    Past Surgical History:  Procedure Laterality Date  . BREAST ENHANCEMENT SURGERY  2000-2001   Family History  Problem Relation Age of Onset  . Hyperlipidemia Mother   . Hypertension Mother   . Hyperlipidemia Father   . Hypertension Father   . Diabetes Father   . Hypertension Maternal Grandfather   . Multiple sclerosis Maternal Grandmother   . Stroke Neg Hx   . Heart disease Neg Hx    Social History   Social History  . Marital status: Married    Spouse name: N/A  . Number of children: N/A  . Years of education: N/A   Occupational History  . Not on file.   Social History Main Topics  . Smoking status: Former Games developermoker  . Smokeless  tobacco: Never Used  . Alcohol use 0.0 oz/week     Comment: occasional  . Drug use: No  . Sexual activity: Yes    Partners: Male   Other Topics Concern  . Not on file   Social History Narrative  . No narrative on file    Interim medical history since last visit reviewed. Allergies and medications reviewed  Review of Systems Per HPI unless specifically indicated above     Objective:    BP 114/68   Pulse 92   Temp 98.2 F (36.8 C) (Oral)   Resp 16   Wt 220 lb 4.8 oz (99.9 kg)   LMP 05/13/2017   SpO2 96%   BMI 41.63 kg/m   Wt Readings from Last 3 Encounters:  05/24/17 220 lb 4.8 oz (99.9 kg)  10/18/16 215 lb (97.5 kg)  08/21/16 217 lb 3 oz (98.5 kg)    Physical Exam  Constitutional: She appears well-developed and well-nourished. No distress.  Morbidly obese; weight gain more than 5 pounds over last 7 months  Cardiovascular: Normal rate and regular rhythm.   Pulmonary/Chest: Effort normal and breath sounds normal. She has no wheezes.  Abdominal: She  exhibits no distension.  Neurological: She is alert. She displays no tremor.  Skin: No pallor.  No significant hair breakage or scalp erythema or scale  Psychiatric: She has a normal mood and affect. Her behavior is normal. Judgment and thought content normal.       Assessment & Plan:   Problem List Items Addressed This Visit      Cardiovascular and Mediastinum   Hypertension goal BP (blood pressure) < 140/90 - Primary    Decrease hctz to help with weight loss and prevent dehydration      Relevant Medications   hydrochlorothiazide (HYDRODIURIL) 25 MG tablet     Other   Pre-diabetes (Chronic)    Check A1c; working on weight loss      Relevant Orders   Hemoglobin A1c (Completed)   Microcytosis    Check ferritin and CBC to see if iron still needed; stool cards given      Relevant Orders   CBC with Differential/Platelet (Completed)   Ferritin (Completed)   Medication monitoring encounter   Relevant  Orders   Comprehensive metabolic panel (Completed)   HLD (hyperlipidemia)    Check lipids panel      Relevant Medications   hydrochlorothiazide (HYDRODIURIL) 25 MG tablet   Other Relevant Orders   Lipid panel (Completed)   Hair loss    Check TSH      Fatigue   Relevant Orders   TSH (Completed)   VITAMIN D 25 Hydroxy (Vit-D Deficiency, Fractures) (Completed)       Follow up plan: Return in about 4 weeks (around 06/21/2017) for follow-up visit with Dr. Sherie Don.  An after-visit summary was printed and given to the patient at check-out.  Please see the patient instructions which may contain other information and recommendations beyond what is mentioned above in the assessment and plan.  Meds ordered this encounter  Medications  . hydrochlorothiazide (HYDRODIURIL) 25 MG tablet    Sig: Take 0.5 tablets (12.5 mg total) by mouth daily.    Dispense:  15 tablet    Refill:  5    Orders Placed This Encounter  Procedures  . Comprehensive metabolic panel  . CBC with Differential/Platelet  . Hemoglobin A1c  . Lipid panel  . TSH  . Ferritin  . VITAMIN D 25 Hydroxy (Vit-D Deficiency, Fractures)

## 2017-05-24 NOTE — Assessment & Plan Note (Signed)
Check lipids panel 

## 2017-05-25 LAB — CBC WITH DIFFERENTIAL/PLATELET
BASOS ABS: 0 10*3/uL (ref 0.0–0.2)
BASOS: 0 %
EOS (ABSOLUTE): 0.3 10*3/uL (ref 0.0–0.4)
Eos: 4 %
Hematocrit: 37.2 % (ref 34.0–46.6)
Hemoglobin: 12.2 g/dL (ref 11.1–15.9)
IMMATURE GRANS (ABS): 0 10*3/uL (ref 0.0–0.1)
IMMATURE GRANULOCYTES: 0 %
LYMPHS: 36 %
Lymphocytes Absolute: 2.8 10*3/uL (ref 0.7–3.1)
MCH: 25.8 pg — ABNORMAL LOW (ref 26.6–33.0)
MCHC: 32.8 g/dL (ref 31.5–35.7)
MCV: 79 fL (ref 79–97)
Monocytes Absolute: 0.6 10*3/uL (ref 0.1–0.9)
Monocytes: 8 %
NEUTROS PCT: 52 %
Neutrophils Absolute: 4 10*3/uL (ref 1.4–7.0)
PLATELETS: 246 10*3/uL (ref 150–379)
RBC: 4.72 x10E6/uL (ref 3.77–5.28)
RDW: 15.7 % — ABNORMAL HIGH (ref 12.3–15.4)
WBC: 7.7 10*3/uL (ref 3.4–10.8)

## 2017-05-25 LAB — COMPREHENSIVE METABOLIC PANEL
A/G RATIO: 1.4 (ref 1.2–2.2)
ALT: 15 IU/L (ref 0–32)
AST: 16 IU/L (ref 0–40)
Albumin: 4.3 g/dL (ref 3.5–5.5)
Alkaline Phosphatase: 58 IU/L (ref 39–117)
BUN/Creatinine Ratio: 15 (ref 9–23)
BUN: 12 mg/dL (ref 6–20)
Bilirubin Total: 0.4 mg/dL (ref 0.0–1.2)
CALCIUM: 9.4 mg/dL (ref 8.7–10.2)
CHLORIDE: 98 mmol/L (ref 96–106)
CO2: 25 mmol/L (ref 20–29)
Creatinine, Ser: 0.82 mg/dL (ref 0.57–1.00)
GFR calc Af Amer: 104 mL/min/{1.73_m2} (ref >59)
GFR, EST NON AFRICAN AMERICAN: 90 mL/min/{1.73_m2} (ref >59)
Globulin, Total: 3 g/dL (ref 1.5–4.5)
Glucose: 97 mg/dL (ref 65–99)
POTASSIUM: 3.7 mmol/L (ref 3.5–5.2)
Sodium: 138 mmol/L (ref 134–144)
Total Protein: 7.3 g/dL (ref 6.0–8.5)

## 2017-05-25 LAB — LIPID PANEL
CHOLESTEROL TOTAL: 226 mg/dL — AB (ref 100–199)
Chol/HDL Ratio: 4.6 ratio — ABNORMAL HIGH (ref 0.0–4.4)
HDL: 49 mg/dL (ref >39)
LDL Calculated: 158 mg/dL — ABNORMAL HIGH (ref 0–99)
TRIGLYCERIDES: 95 mg/dL (ref 0–149)
VLDL Cholesterol Cal: 19 mg/dL (ref 5–40)

## 2017-05-25 LAB — TSH: TSH: 1.38 u[IU]/mL (ref 0.450–4.500)

## 2017-05-25 LAB — HEMOGLOBIN A1C
ESTIMATED AVERAGE GLUCOSE: 123 mg/dL
HEMOGLOBIN A1C: 5.9 % — AB (ref 4.8–5.6)

## 2017-05-25 LAB — VITAMIN D 25 HYDROXY (VIT D DEFICIENCY, FRACTURES): VIT D 25 HYDROXY: 40.4 ng/mL (ref 30.0–100.0)

## 2017-05-25 LAB — FERRITIN: Ferritin: 58 ng/mL (ref 15–150)

## 2017-05-29 ENCOUNTER — Telehealth: Payer: Self-pay

## 2017-05-29 ENCOUNTER — Other Ambulatory Visit: Payer: Self-pay

## 2017-05-29 DIAGNOSIS — E782 Mixed hyperlipidemia: Secondary | ICD-10-CM

## 2017-05-29 NOTE — Telephone Encounter (Signed)
Spoke to pt regarding her lab results.

## 2017-06-25 ENCOUNTER — Ambulatory Visit: Payer: 59 | Admitting: Family Medicine

## 2017-07-02 ENCOUNTER — Encounter: Payer: Self-pay | Admitting: Family Medicine

## 2017-07-02 ENCOUNTER — Ambulatory Visit (INDEPENDENT_AMBULATORY_CARE_PROVIDER_SITE_OTHER): Payer: 59 | Admitting: Family Medicine

## 2017-07-02 VITALS — BP 112/60 | HR 85 | Temp 98.1°F | Resp 14 | Wt 220.2 lb

## 2017-07-02 DIAGNOSIS — R7303 Prediabetes: Secondary | ICD-10-CM

## 2017-07-02 DIAGNOSIS — M545 Low back pain: Secondary | ICD-10-CM | POA: Diagnosis not present

## 2017-07-02 DIAGNOSIS — J0100 Acute maxillary sinusitis, unspecified: Secondary | ICD-10-CM

## 2017-07-02 DIAGNOSIS — G8929 Other chronic pain: Secondary | ICD-10-CM | POA: Diagnosis not present

## 2017-07-02 DIAGNOSIS — I10 Essential (primary) hypertension: Secondary | ICD-10-CM

## 2017-07-02 DIAGNOSIS — Z23 Encounter for immunization: Secondary | ICD-10-CM | POA: Diagnosis not present

## 2017-07-02 DIAGNOSIS — E782 Mixed hyperlipidemia: Secondary | ICD-10-CM

## 2017-07-02 MED ORDER — HYDROCHLOROTHIAZIDE 25 MG PO TABS: 25.0000 mg | ORAL_TABLET | Freq: Every day | ORAL | 5 refills | Status: DC

## 2017-07-02 MED ORDER — AMOXICILLIN-POT CLAVULANATE 875-125 MG PO TABS: 1.0000 | ORAL_TABLET | Freq: Two times a day (BID) | ORAL | 0 refills | Status: AC

## 2017-07-02 NOTE — Assessment & Plan Note (Signed)
Work on low saturated fat diet

## 2017-07-02 NOTE — Assessment & Plan Note (Signed)
Weight loss is absolutely key to slowing down progression to diabetes

## 2017-07-02 NOTE — Progress Notes (Signed)
BP 112/60   Pulse 85   Temp 98.1 F (36.7 C) (Oral)   Resp 14   Wt 220 lb 3.2 oz (99.9 kg)   LMP 06/07/2017   SpO2 97%   BMI 41.61 kg/m    Subjective:    Patient ID: Alexis Moody, female    DOB: 10-31-1976, 41 y.o.   MRN: [id number]  HPI: Alexis Moody is a 41 y.o. female  Chief Complaint  Patient presents with  . Follow-up    4 weeks   HPI  Patient is here for four week follow-up She was last seen on July 12th We talked about her weight, trying to watch portions Prediabetes; would like her A1c rechecked in October because she gets her labs for free We decreased her HCTZ by 50%; however, her BP was 146/96 at the GYN; her BP was up at her health screening; not sure if from walking; no headaches; no chest pain; started detox yesterday with ginger and lemons; heart was beating fast last night, but no pressure Since last visit, drinking more water; when she is standing and washing dishes, her lower back is killing her; she thinks it is her weight; has to lean over the sink sometimes, pushes the cart and aching; going on for a few months; no blood in the urine; no numbness or weakness going tdown the legs; no loss of B/B function Not doing any calorie counts; has a step tracker on the phone; sits at her desk She was having a shart pain in her leg For two weeks, her nose has been stopped up; no fevers; one side not worse than the other; blowing out some yellow stuff; sometimes clear, sometimes yellow  Depression screen [facility] 2/9 07/02/2017 05/24/2017 08/21/2016 08/16/2015  Decreased Interest 0 0 0 0  Down, Depressed, Hopeless 0 0 0 0  PHQ - 2 Score 0 0 0 0    Relevant past medical, surgical, family and social history reviewed Past Medical History:  Diagnosis Date  . Hyperlipidemia   . Hypertension   . Obesity   . Obesity, Class II, BMI 35-39.9, with comorbidity    Past Surgical History:  Procedure Laterality Date  . BREAST ENHANCEMENT SURGERY  2000-2001   Family History    Problem Relation Age of Onset  . Hyperlipidemia Mother   . Hypertension Mother   . Hyperlipidemia Father   . Hypertension Father   . Diabetes Father   . Hypertension Maternal Grandfather   . Multiple sclerosis Maternal Grandmother   . Stroke Neg Hx   . Heart disease Neg Hx    Social History   Social History  . Marital status: Married    Spouse name: N/A  . Number of children: N/A  . Years of education: N/A   Occupational History  . Not on file.   Social History Main Topics  . Smoking status: Former Games developer  . Smokeless tobacco: Never Used  . Alcohol use 0.0 oz/week     Comment: occasional  . Drug use: No  . Sexual activity: Yes    Partners: Male   Other Topics Concern  . Not on file   Social History Narrative  . No narrative on file    Interim medical history since last visit reviewed. Allergies and medications reviewed  Review of Systems Per HPI unless specifically indicated above     Objective:    BP 112/60   Pulse 85   Temp 98.1 F (36.7 C) (Oral)   Resp 14  Wt 220 lb 3.2 oz (99.9 kg)   LMP 06/07/2017   SpO2 97%   BMI 41.61 kg/m   Wt Readings from Last 3 Encounters:  07/02/17 220 lb 3.2 oz (99.9 kg)  05/24/17 220 lb 4.8 oz (99.9 kg)  10/18/16 215 lb (97.5 kg)    Physical Exam  Constitutional: She appears well-developed and well-nourished. No distress.  Morbidly obese; weight stable  HENT:  Right Ear: Hearing normal.  Left Ear: Hearing normal.  Nose: Mucosal edema and rhinorrhea present. No sinus tenderness. Right sinus exhibits no maxillary sinus tenderness and no frontal sinus tenderness. Left sinus exhibits no maxillary sinus tenderness and no frontal sinus tenderness.  Mouth/Throat: Oropharynx is clear and moist and mucous membranes are normal.  Yellowish material in nasal passages, left > right; mucosa is erythematous left > right  Cardiovascular: Normal rate and regular rhythm.   Pulmonary/Chest: Effort normal and breath sounds normal.  She has no wheezes.  Abdominal: She exhibits no distension.  Musculoskeletal:       Lumbar back: She exhibits tenderness (mild tenderness bilateral paravertebral muscles). She exhibits normal range of motion, no bony tenderness, no swelling, no edema, no deformity and no spasm.  Lymphadenopathy:       Head (left side): Preauricular adenopathy: shoddy AC LAD on the left.    She has cervical adenopathy.  Neurological: She is alert. She displays no tremor.  Skin: No rash noted. No pallor.  No significant hair breakage or scalp erythema or scale  Psychiatric: She has a normal mood and affect. Her behavior is normal. Judgment and thought content normal.   Results for orders placed or performed in visit on 05/24/17  Comprehensive metabolic panel  Result Value Ref Range   Glucose 97 65 - 99 mg/dL   BUN 12 6 - 20 mg/dL   Creatinine, Ser 1.61 0.57 - 1.00 mg/dL   GFR calc non Af Amer 90 >59 mL/min/1.73   GFR calc Af Amer 104 >59 mL/min/1.73   BUN/Creatinine Ratio 15 9 - 23   Sodium 138 134 - 144 mmol/L   Potassium 3.7 3.5 - 5.2 mmol/L   Chloride 98 96 - 106 mmol/L   CO2 25 20 - 29 mmol/L   Calcium 9.4 8.7 - 10.2 mg/dL   Total Protein 7.3 6.0 - 8.5 g/dL   Albumin 4.3 3.5 - 5.5 g/dL   Globulin, Total 3.0 1.5 - 4.5 g/dL   Albumin/Globulin Ratio 1.4 1.2 - 2.2   Bilirubin Total 0.4 0.0 - 1.2 mg/dL   Alkaline Phosphatase 58 39 - 117 IU/L   AST 16 0 - 40 IU/L   ALT 15 0 - 32 IU/L  CBC with Differential/Platelet  Result Value Ref Range   WBC 7.7 3.4 - 10.8 x10E3/uL   RBC 4.72 3.77 - 5.28 x10E6/uL   Hemoglobin 12.2 11.1 - 15.9 g/dL   Hematocrit 09.6 04.5 - 46.6 %   MCV 79 79 - 97 fL   MCH 25.8 (L) 26.6 - 33.0 pg   MCHC 32.8 31.5 - 35.7 g/dL   RDW 40.9 (H) 81.1 - 91.4 %   Platelets 246 150 - 379 x10E3/uL   Neutrophils 52 Not Estab. %   Lymphs 36 Not Estab. %   Monocytes 8 Not Estab. %   Eos 4 Not Estab. %   Basos 0 Not Estab. %   Neutrophils Absolute 4.0 1.4 - 7.0 x10E3/uL    Lymphocytes Absolute 2.8 0.7 - 3.1 x10E3/uL   Monocytes Absolute 0.6 0.1 -  0.9 x10E3/uL   EOS (ABSOLUTE) 0.3 0.0 - 0.4 x10E3/uL   Basophils Absolute 0.0 0.0 - 0.2 x10E3/uL   Immature Granulocytes 0 Not Estab. %   Immature Grans (Abs) 0.0 0.0 - 0.1 x10E3/uL  Hemoglobin A1c  Result Value Ref Range   Hgb A1c MFr Bld 5.9 (H) 4.8 - 5.6 %   Est. average glucose Bld gHb Est-mCnc 123 mg/dL  Lipid panel  Result Value Ref Range   Cholesterol, Total 226 (H) 100 - 199 mg/dL   Triglycerides 95 0 - 149 mg/dL   HDL 49 >67 mg/dL   VLDL Cholesterol Cal 19 5 - 40 mg/dL   LDL Calculated 703 (H) 0 - 99 mg/dL   Chol/HDL Ratio 4.6 (H) 0.0 - 4.4 ratio  TSH  Result Value Ref Range   TSH 1.380 0.450 - 4.500 uIU/mL  Ferritin  Result Value Ref Range   Ferritin 58 15 - 150 ng/mL  VITAMIN D 25 Hydroxy (Vit-D Deficiency, Fractures)  Result Value Ref Range   Vit D, 25-Hydroxy 40.4 30.0 - 100.0 ng/mL      Assessment & Plan:   Problem List Items Addressed This Visit      Cardiovascular and Mediastinum   Hypertension goal BP (blood pressure) < 140/90    Will go back to previous dose of BP medicine and then plan to reduce again after weight loss      Relevant Medications   hydrochlorothiazide (HYDRODIURIL) 25 MG tablet     Other   Pre-diabetes (Chronic)    Weight loss is absolutely key to slowing down progression to diabetes      Relevant Orders   Hemoglobin A1c   Morbid obesity (HCC)    Working on this; will have try to lose 1/2 to 1 pound per week; goal is 26 + pounds over the next year or a weight less than or equal to 194 pounds by this time next year (most conservative); form completed for work; refer to nutritionist      Relevant Orders   Amb ref to Medical Nutrition Therapy-MNT   HLD (hyperlipidemia)    Work on low saturated fat diet      Relevant Medications   hydrochlorothiazide (HYDRODIURIL) 25 MG tablet    Other Visit Diagnoses    Chronic bilateral low back pain without sciatica     -  Primary   Relevant Orders   Urinalysis w microscopic + reflex cultur   Ambulatory referral to Physical Therapy   Urinalysis, Routine w reflex microscopic   Need for Tdap vaccination       Relevant Orders   Tdap vaccine greater than or equal to 7yo IM (Completed)   Acute non-recurrent maxillary sinusitis       try nasal saline, neti pot; use antibiotic if not resolving; warned about possible C diff   Relevant Medications   amoxicillin-clavulanate (AUGMENTIN) 875-125 MG tablet       Follow up plan: Return in about 3 months (around 10/02/2017) for follow-up visit with Dr. Sherie Don.  An after-visit summary was printed and given to the patient at check-out.  Please see the patient instructions which may contain other information and recommendations beyond what is mentioned above in the assessment and plan.  Meds ordered this encounter  Medications  . hydrochlorothiazide (HYDRODIURIL) 25 MG tablet    Sig: Take 1 tablet (25 mg total) by mouth daily.    Dispense:  30 tablet    Refill:  5  . amoxicillin-clavulanate (AUGMENTIN) 875-125 MG tablet  Sig: Take 1 tablet by mouth 2 (two) times daily.    Dispense:  20 tablet    Refill:  0    Orders Placed This Encounter  Procedures  . Tdap vaccine greater than or equal to 7yo IM  . Urinalysis w microscopic + reflex cultur  . Hemoglobin A1c  . Urinalysis, Routine w reflex microscopic  . Ambulatory referral to Physical Therapy  . Amb ref to Medical Nutrition Therapy-MNT

## 2017-07-02 NOTE — Assessment & Plan Note (Signed)
Working on this; will have try to lose 1/2 to 1 pound per week; goal is 26 + pounds over the next year or a weight less than or equal to 194 pounds by this time next year (most conservative); form completed for work; refer to nutritionist

## 2017-07-02 NOTE — Patient Instructions (Addendum)
Increase the HCTZ back up to 25 mg daily We'll have you work with the physical therapist Use some sort of calorie tracker to help you get an idea about how much you're taking in Use a step tracker to keep up with your activity level Consider standing desk or using a box to elevate your workspace so you can stand some at work Please have your fasting cholesterol and A1c checked October 17th or just after Return in 3 months for weight check and discussion with Dr. Sherie Don  Check out the information at familydoctor.org entitled "Nutrition for Weight Loss: What You Need to Know about Fad Diets" Try to lose between 1-2 pounds per week by taking in fewer calories and burning off more calories You can succeed by limiting portions, limiting foods dense in calories and fat, becoming more active, and drinking 8 glasses of water a day (64 ounces) Don't skip meals, especially breakfast, as skipping meals may alter your metabolism Do not use over-the-counter weight loss pills or gimmicks that claim rapid weight loss A healthy BMI (or body mass index) is between 18.5 and 24.9 You can calculate your ideal BMI at the NIH website [employer]  Try nasal saline spray or neti pot to rinse out nasal passages Fill the antibiotic if needed Please do eat yogurt daily or take a probiotic daily for the next month We want to replace the healthy germs in the gut If you notice foul, watery diarrhea in the next two months, schedule an appointment RIGHT AWAY

## 2017-07-02 NOTE — Assessment & Plan Note (Signed)
Will go back to previous dose of BP medicine and then plan to reduce again after weight loss

## 2017-07-03 LAB — URINALYSIS, ROUTINE W REFLEX MICROSCOPIC
Bilirubin, UA: NEGATIVE
GLUCOSE, UA: NEGATIVE
KETONES UA: NEGATIVE
LEUKOCYTES UA: NEGATIVE
NITRITE UA: NEGATIVE
Protein, UA: NEGATIVE
RBC, UA: NEGATIVE
SPEC GRAV UA: 1.018 (ref 1.005–1.030)
Urobilinogen, Ur: 0.2 mg/dL (ref 0.2–1.0)
pH, UA: 6 (ref 5.0–7.5)

## 2017-10-01 ENCOUNTER — Ambulatory Visit: Payer: 59 | Admitting: Family Medicine

## 2017-12-29 NOTE — Progress Notes (Signed)
I came across what looks like blank note from 6/17; signing off

## 2017-12-29 NOTE — Telephone Encounter (Signed)
Open note discovered random audit Patient requested medicine in July 2018 Appointment was scheduled then Closing out this open note

## 2017-12-29 NOTE — Progress Notes (Signed)
Closing out lab/order note open since:  August 2017 

## 2018-01-30 ENCOUNTER — Other Ambulatory Visit: Payer: Self-pay | Admitting: Family Medicine

## 2018-01-30 DIAGNOSIS — I10 Essential (primary) hypertension: Secondary | ICD-10-CM

## 2018-02-22 ENCOUNTER — Ambulatory Visit: Payer: Managed Care, Other (non HMO) | Admitting: Family Medicine

## 2018-02-22 ENCOUNTER — Encounter: Payer: Self-pay | Admitting: Family Medicine

## 2018-02-22 VITALS — BP 128/78 | HR 78 | Temp 98.6°F | Ht 60.0 in | Wt 217.4 lb

## 2018-02-22 DIAGNOSIS — R8290 Unspecified abnormal findings in urine: Secondary | ICD-10-CM

## 2018-02-22 DIAGNOSIS — R2232 Localized swelling, mass and lump, left upper limb: Secondary | ICD-10-CM

## 2018-02-22 DIAGNOSIS — I10 Essential (primary) hypertension: Secondary | ICD-10-CM | POA: Diagnosis not present

## 2018-02-22 DIAGNOSIS — R718 Other abnormality of red blood cells: Secondary | ICD-10-CM

## 2018-02-22 DIAGNOSIS — R7303 Prediabetes: Secondary | ICD-10-CM | POA: Diagnosis not present

## 2018-02-22 DIAGNOSIS — Z5181 Encounter for therapeutic drug level monitoring: Secondary | ICD-10-CM

## 2018-02-22 DIAGNOSIS — E782 Mixed hyperlipidemia: Secondary | ICD-10-CM

## 2018-02-22 LAB — POCT URINALYSIS DIPSTICK
Appearance: NORMAL
BILIRUBIN UA: NEGATIVE
Blood, UA: NEGATIVE
GLUCOSE UA: NEGATIVE
KETONES UA: NEGATIVE
Leukocytes, UA: NEGATIVE
Nitrite, UA: NEGATIVE
Protein, UA: NEGATIVE
SPEC GRAV UA: 1.02 (ref 1.010–1.025)
Urobilinogen, UA: 0.2 E.U./dL
pH, UA: 6 (ref 5.0–8.0)

## 2018-02-22 NOTE — Patient Instructions (Addendum)
Check out the information at familydoctor.org entitled "Nutrition for Weight Loss: What You Need to Know about Fad Diets" Try to lose between 1-2 pounds per week by taking in fewer calories and burning off more calories You can succeed by limiting portions, limiting foods dense in calories and fat, becoming more active, and drinking 8 glasses of water a day (64 ounces) Don't skip meals, especially breakfast, as skipping meals may alter your metabolism Do not use over-the-counter weight loss pills or gimmicks that claim rapid weight loss A healthy BMI (or body mass index) is between 18.5 and 24.9 You can calculate your ideal BMI at the NIH website [employer]  Set a goal for walking 3 days a week, aim for 30 minutes; build up gradually; increase to 4 and then 5 days after a while  Consider Saxenda  Please call [phone] to schedule your imaging test Please wait 2-3 days after the order has been placed to call and get your test scheduled   Obesity, Adult Obesity is the condition of having too much total body fat. Being overweight or obese means that your weight is greater than what is considered healthy for your body size. Obesity is determined by a measurement called BMI. BMI is an estimate of body fat and is calculated from height and weight. For adults, a BMI of 30 or higher is considered obese. Obesity can eventually lead to other health concerns and major illnesses, including:  Stroke.  Coronary artery disease (CAD).  Type 2 diabetes.  Some types of cancer, including cancers of the colon, breast, uterus, and gallbladder.  Osteoarthritis.  High blood pressure (hypertension).  High cholesterol.  Sleep apnea.  Gallbladder stones.  Infertility problems.  What are the causes? The main cause of obesity is taking in (consuming) more calories than your body uses for energy. Other factors that contribute to this condition  may include:  Being born with genes that make you more likely to become obese.  Having a medical condition that causes obesity. These conditions include: ? Hypothyroidism. ? Polycystic ovarian syndrome (PCOS). ? Binge-eating disorder. ? Cushing syndrome.  Taking certain medicines, such as steroids, antidepressants, and seizure medicines.  Not being physically active (sedentary lifestyle).  Living where there are limited places to exercise safely or buy healthy foods.  Not getting enough sleep.  What increases the risk? The following factors may increase your risk of this condition:  Having a family history of obesity.  Being a woman of African-American descent.  Being a man of Hispanic descent.  What are the signs or symptoms? Having excessive body fat is the main symptom of this condition. How is this diagnosed? This condition may be diagnosed based on:  Your symptoms.  Your medical history.  A physical exam. Your health care provider may measure: ? Your BMI. If you are an adult with a BMI between 25 and less than 30, you are considered overweight. If you are an adult with a BMI of 30 or higher, you are considered obese. ? The distances around your hips and your waist (circumferences). These may be compared to each other to help diagnose your condition. ? Your skinfold thickness. Your health care provider may gently pinch a fold of your skin and measure it.  How is this treated? Treatment for this condition often includes changing your lifestyle. Treatment may include some or all of the following:  Dietary changes. Work with your health care provider and a dietitian to set a weight-loss goal that is  healthy and reasonable for you. Dietary changes may include eating: ? Smaller portions. A portion size is the amount of a particular food that is healthy for you to eat at one time. This varies from person to person. ? Low-calorie or low-fat options. ? More whole grains,  fruits, and vegetables.  Regular physical activity. This may include aerobic activity (cardio) and strength training.  Medicine to help you lose weight. Your health care provider may prescribe medicine if you are unable to lose 1 pound a week after 6 weeks of eating more healthily and doing more physical activity.  Surgery. Surgical options may include gastric banding and gastric bypass. Surgery may be done if: ? Other treatments have not helped to improve your condition. ? You have a BMI of 40 or higher. ? You have life-threatening health problems related to obesity.  Follow these instructions at home:  Eating and drinking   Follow recommendations from your health care provider about what you eat and drink. Your health care provider may advise you to: ? Limit fast foods, sweets, and processed snack foods. ? Choose low-fat options, such as low-fat milk instead of whole milk. ? Eat 5 or more servings of fruits or vegetables every day. ? Eat at home more often. This gives you more control over what you eat. ? Choose healthy foods when you eat out. ? Learn what a healthy portion size is. ? Keep low-fat snacks on hand. ? Avoid sugary drinks, such as soda, fruit juice, iced tea sweetened with sugar, and flavored milk. ? Eat a healthy breakfast.  Drink enough water to keep your urine clear or pale yellow.  Do not go without eating for long periods of time (do not fast) or follow a fad diet. Fasting and fad diets can be unhealthy and even dangerous. Physical Activity  Exercise regularly, as told by your health care provider. Ask your health care provider what types of exercise are safe for you and how often you should exercise.  Warm up and stretch before being active.  Cool down and stretch after being active.  Rest between periods of activity. Lifestyle  Limit the time that you spend in front of your TV, computer, or video game system.  Find ways to reward yourself that do not  involve food.  Limit alcohol intake to no more than 1 drink a day for nonpregnant women and 2 drinks a day for men. One drink equals 12 oz of beer, 5 oz of wine, or 1 oz of hard liquor. General instructions  Keep a weight loss journal to keep track of the food you eat and how much you exercise you get.  Take over-the-counter and prescription medicines only as told by your health care provider.  Take vitamins and supplements only as told by your health care provider.  Consider joining a support group. Your health care provider may be able to recommend a support group.  Keep all follow-up visits as told by your health care provider. This is important. Contact a health care provider if:  You are unable to meet your weight loss goal after 6 weeks of dietary and lifestyle changes. This information is not intended to replace advice given to you by your health care provider. Make sure you discuss any questions you have with your health care provider. Document Released: 12/07/2004 Document Revised: 04/03/2016 Document Reviewed: 08/18/2015 Elsevier Interactive Patient Education  2018 [employer]

## 2018-02-22 NOTE — Assessment & Plan Note (Signed)
Monitor liver and kidneys 

## 2018-02-22 NOTE — Assessment & Plan Note (Signed)
Encouraged weight loss, healthier eating; consider metformin; check A1c

## 2018-02-22 NOTE — Assessment & Plan Note (Signed)
Check ferritin and CBC. 

## 2018-02-22 NOTE — Progress Notes (Signed)
BP 128/78 (BP Location: Right Arm, Patient Position: Sitting, Cuff Size: Large)   Pulse 78   Temp 98.6 F (37 C) (Oral)   Ht 5' (1.524 m)   Wt 217 lb 6.4 oz (98.6 kg)   LMP 01/29/2018   SpO2 99%   BMI 42.46 kg/m    Subjective:    Patient ID: Alexis Moody, female    DOB: 15-Feb-1976, 42 y.o.   MRN: [id number]  HPI: Alexis OverlieQiana Heffley is a 42 y.o. female  Chief Complaint  Patient presents with  . Follow-up    pt states that urine looks different   . Mass    Under left arm pit, not tender     HPI Patient is here for f/u  She has a mass under left arm pit; may have been there last year; it's on the bone part both sides (left and right); this one under the left is "more", like it's swollen; not sure if related to her sinuses; nose has been stuffy; on-call told her to get sudafed for stuffy nose; nose issues were last year; improved with antibiotics; stopped back up and comes back and goes away; not bad now, just clear; maybe allergies; no fevers  No lumps in the breast; no cough; no explained weight loss or night sweats  She says her urine is different; she thinks it might be oily, looks different; going on for just a week; no odor; no frequency, not going more often; no fevers; no abd or back pain  HTN; not checking at home; adds a little salt to her food when cooking; not adding any at the table; salt and vinegar chips; maybe a handful every day with sandwich; not extra stress; relative in the hospital for 3 weeks  Obesity; she has co-workers doing intermittent fasting; she is thinking about that; advised against that, which is like skipping a meal; she is not interested in medicine when I offered; thinning hair which might be from her medicine; she is trying to incorporate; she will try for 3 days a week, after work; no hx of MTC or fam hx of MEN-2  [facility] 02/22/2018 07/02/2017 05/24/2017 08/21/2016 08/16/2015  Decreased Interest 0 0 0 0 0  Down, Depressed, Hopeless 0 0  0 0 0  PHQ - 2 Score 0 0 0 0 0    Relevant past medical, surgical, family and social history reviewed Past Medical History:  Diagnosis Date  . Hyperlipidemia   . Hypertension   . Obesity   . Obesity, Class II, BMI 35-39.9, with comorbidity    Past Surgical History:  Procedure Laterality Date  . BREAST ENHANCEMENT SURGERY  2000-2001   Family History  Problem Relation Age of Onset  . Hyperlipidemia Mother   . Hypertension Mother   . Breast cancer Mother   . Hyperlipidemia Father   . Hypertension Father   . Diabetes Father   . Hypertension Maternal Grandfather   . Multiple sclerosis Maternal Grandmother   . Stroke Neg Hx   . Heart disease Neg Hx    Social History   Tobacco Use  . Smoking status: Former Games developermoker  . Smokeless tobacco: Never Used  Substance Use Topics  . Alcohol use: Yes    Alcohol/week: 0.0 oz    Comment: occasional  . Drug use: No    Interim medical history since last visit reviewed. Allergies and medications reviewed  Review of Systems Per HPI unless specifically indicated above     Objective:  BP 128/78 (BP Location: Right Arm, Patient Position: Sitting, Cuff Size: Large)   Pulse 78   Temp 98.6 F (37 C) (Oral)   Ht 5' (1.524 m)   Wt 217 lb 6.4 oz (98.6 kg)   LMP 01/29/2018   SpO2 99%   BMI 42.46 kg/m   Wt Readings from Last 3 Encounters:  02/22/18 217 lb 6.4 oz (98.6 kg)  07/02/17 220 lb 3.2 oz (99.9 kg)  05/24/17 220 lb 4.8 oz (99.9 kg)    Physical Exam  Constitutional: She appears well-developed and well-nourished. No distress.  HENT:  Head: Normocephalic and atraumatic.  Eyes: EOM are normal. No scleral icterus.  Neck: No thyromegaly present.  Cardiovascular: Normal rate, regular rhythm and normal heart sounds.  No murmur heard. Pulmonary/Chest: Effort normal and breath sounds normal. No respiratory distress. She has no wheezes. Right breast exhibits no inverted nipple, no mass, no nipple discharge, no skin change and no  tenderness. Left breast exhibits no inverted nipple, no mass, no nipple discharge, no skin change and no tenderness.  Firm swelling in the LEFT axilla    Abdominal: Soft. Bowel sounds are normal. She exhibits no distension.  Musculoskeletal: She exhibits no edema.  Neurological: She is alert. She exhibits normal muscle tone.  Skin: Skin is warm and dry. She is not diaphoretic. No pallor.  Psychiatric: She has a normal mood and affect. Her behavior is normal. Judgment and thought content normal.   Results for orders placed or performed in visit on 02/22/18  POCT urinalysis dipstick  Result Value Ref Range   Color, UA yellow    Clarity, UA clear    Glucose, UA neg    Bilirubin, UA neg    Ketones, UA neg    Spec Grav, UA 1.020 1.010 - 1.025   Blood, UA neg    pH, UA 6.0 5.0 - 8.0   Protein, UA neg    Urobilinogen, UA 0.2 0.2 or 1.0 E.U./dL   Nitrite, UA neg    Leukocytes, UA Negative Negative   Appearance normal    Odor none       Assessment & Plan:   Problem List Items Addressed This Visit      Cardiovascular and Mediastinum   Hypertension goal BP (blood pressure) < 140/90    Patient wishes to stay on the HCTZ; work on weight loss, DASH guidelines encouraged        Other   HLD (hyperlipidemia)   Relevant Orders   Lipid panel   Pre-diabetes - Primary (Chronic)    Encouraged weight loss, healthier eating; consider metformin; check A1c      Relevant Orders   Hemoglobin A1c   Morbid obesity (HCC)    Encouraged weight loss; see AVS; she'll work on it herself; open invitation int he future for bariatric clinic or dietician or medicine; consider Saxenda or pill Belviq      Relevant Orders   TSH   Microcytosis    Check ferritin and CBC      Relevant Orders   CBC with Differential/Platelet   Ferritin   Medication monitoring encounter    Monitor liver and kidneys      Relevant Orders   Comprehensive metabolic panel    Other Visit Diagnoses    Urine  abnormality       Relevant Orders   POCT urinalysis dipstick (Completed)   Axillary mass, left       Relevant Orders   [place] AXILLA LEFT  Follow up plan: Return in about 3 months (around 05/24/2018).  An after-visit summary was printed and given to the patient at check-out.  Please see the patient instructions which may contain other information and recommendations beyond what is mentioned above in the assessment and plan.  No orders of the defined types were placed in this encounter.   Orders Placed This Encounter  Procedures  . [place] AXILLA LEFT  . Hemoglobin A1c  . Lipid panel  . CBC with Differential/Platelet  . Comprehensive metabolic panel  . TSH  . Ferritin  . POCT urinalysis dipstick

## 2018-02-22 NOTE — Assessment & Plan Note (Signed)
Patient wishes to stay on the HCTZ; work on weight loss, DASH guidelines encouraged

## 2018-02-22 NOTE — Assessment & Plan Note (Addendum)
Encouraged weight loss; see AVS; she'll work on it herself; open invitation int he future for bariatric clinic or dietician or medicine; consider Saxenda or pill Belviq

## 2018-02-25 ENCOUNTER — Telehealth: Payer: Self-pay | Admitting: Family Medicine

## 2018-02-25 DIAGNOSIS — R2230 Localized swelling, mass and lump, unspecified upper limb: Secondary | ICD-10-CM

## 2018-02-25 NOTE — Telephone Encounter (Signed)
Please order thank you.

## 2018-02-25 NOTE — Telephone Encounter (Signed)
-----   Message from Hyman HopesMelissa D Burleson, [place] sent at 02/25/2018  2:39 PM EDT ----- Patient needs Diagnostic Bilateral Tomo Mammogram(IMG 5535) with Left Breast Ultrasound(IMG 5531) and Right Breast Ultrasound(IMG 5532) for Left Breast Axilla Mass and Yearly.  Thanks,  General MillsMelissa

## 2018-02-25 NOTE — Telephone Encounter (Signed)
Copied from CRM [phone]. Topic: Quick Communication - See Telephone Encounter >> Feb 25, 2018  4:14 PM Rudi CocoLathan, Keia Rask M, [place] wrote: CRM for notification. See Telephone encounter for: 02/25/18.  Pt. Calling to check to see if Dr. Sherie DonLada has received fax from breast center with notes about pt. So she can schedule an appt. Pt. Would like a callback

## 2018-02-27 ENCOUNTER — Other Ambulatory Visit: Payer: Self-pay | Admitting: Family Medicine

## 2018-02-27 DIAGNOSIS — N6320 Unspecified lump in the left breast, unspecified quadrant: Secondary | ICD-10-CM

## 2018-02-27 NOTE — Telephone Encounter (Signed)
See previous telephone encounter.

## 2018-02-27 NOTE — Telephone Encounter (Signed)
Copied from CRM [phone]. Topic: Quick Communication - See Telephone Encounter >> Feb 25, 2018  4:14 PM Rudi CocoLathan, Alexis Moody, Alexis Moody: CRM for notification. See Telephone encounter for: 02/25/18.  Pt. Calling to check to see if Dr. Sherie DonLada has received fax from breast center with notes about pt. So she can schedule an appt. Pt. Would like a callback    Called pt informed her that correct orders have been placed. Pt gave verbal understanding.

## 2018-03-02 LAB — LIPID PANEL
Chol/HDL Ratio: 4.6 ratio — ABNORMAL HIGH (ref 0.0–4.4)
Cholesterol, Total: 197 mg/dL (ref 100–199)
HDL: 43 mg/dL (ref >39)
LDL CALC: 140 mg/dL — AB (ref 0–99)
Triglycerides: 70 mg/dL (ref 0–149)
VLDL Cholesterol Cal: 14 mg/dL (ref 5–40)

## 2018-03-02 LAB — COMPREHENSIVE METABOLIC PANEL
ALK PHOS: 55 IU/L (ref 39–117)
ALT: 11 IU/L (ref 0–32)
AST: 10 IU/L (ref 0–40)
Albumin/Globulin Ratio: 1.5 (ref 1.2–2.2)
Albumin: 3.9 g/dL (ref 3.5–5.5)
BUN/Creatinine Ratio: 15 (ref 9–23)
BUN: 14 mg/dL (ref 6–24)
Bilirubin Total: 0.4 mg/dL (ref 0.0–1.2)
CO2: 28 mmol/L (ref 20–29)
CREATININE: 0.96 mg/dL (ref 0.57–1.00)
Calcium: 9.1 mg/dL (ref 8.7–10.2)
Chloride: 101 mmol/L (ref 96–106)
GFR calc Af Amer: 86 mL/min/{1.73_m2} (ref >59)
GFR calc non Af Amer: 74 mL/min/{1.73_m2} (ref >59)
GLOBULIN, TOTAL: 2.6 g/dL (ref 1.5–4.5)
GLUCOSE: 94 mg/dL (ref 65–99)
Potassium: 3.7 mmol/L (ref 3.5–5.2)
SODIUM: 140 mmol/L (ref 134–144)
Total Protein: 6.5 g/dL (ref 6.0–8.5)

## 2018-03-02 LAB — FERRITIN: FERRITIN: 51 ng/mL (ref 15–150)

## 2018-03-02 LAB — CBC WITH DIFFERENTIAL/PLATELET
BASOS ABS: 0 10*3/uL (ref 0.0–0.2)
Basos: 1 %
EOS (ABSOLUTE): 0.3 10*3/uL (ref 0.0–0.4)
Eos: 5 %
Hematocrit: 36.2 % (ref 34.0–46.6)
Hemoglobin: 11.7 g/dL (ref 11.1–15.9)
Immature Grans (Abs): 0 10*3/uL (ref 0.0–0.1)
Immature Granulocytes: 0 %
LYMPHS ABS: 2.3 10*3/uL (ref 0.7–3.1)
Lymphs: 40 %
MCH: 26.3 pg — ABNORMAL LOW (ref 26.6–33.0)
MCHC: 32.3 g/dL (ref 31.5–35.7)
MCV: 81 fL (ref 79–97)
MONOS ABS: 0.5 10*3/uL (ref 0.1–0.9)
Monocytes: 8 %
Neutrophils Absolute: 2.7 10*3/uL (ref 1.4–7.0)
Neutrophils: 46 %
Platelets: 234 10*3/uL (ref 150–379)
RBC: 4.45 x10E6/uL (ref 3.77–5.28)
RDW: 15.5 % — AB (ref 12.3–15.4)
WBC: 5.8 10*3/uL (ref 3.4–10.8)

## 2018-03-02 LAB — TSH: TSH: 0.796 u[IU]/mL (ref 0.450–4.500)

## 2018-03-02 LAB — HEMOGLOBIN A1C
Est. average glucose Bld gHb Est-mCnc: 128 mg/dL
Hgb A1c MFr Bld: 6.1 % — ABNORMAL HIGH (ref 4.8–5.6)

## 2018-03-04 ENCOUNTER — Other Ambulatory Visit: Payer: Self-pay | Admitting: Family Medicine

## 2018-03-04 DIAGNOSIS — I10 Essential (primary) hypertension: Secondary | ICD-10-CM

## 2018-03-05 ENCOUNTER — Telehealth: Payer: Self-pay

## 2018-03-05 NOTE — Telephone Encounter (Signed)
-----   Message from Kerman PasseyMelinda P Lada, MD sent at 03/02/2018 11:10 AM EDT ----- Idamae Schullerkie, please let the patient know that her A1c is still in the prediabetes range; I would suggest we try the Saxenda, and I can send a prescription for that; the pharmacist can teach her how to use that; another option would be metformin, which is a pill and is generic and would be much less expensive; let me know which one she'd like and we'll start her on her weight loss journey; her LDL has come down 15 points; keep working on healthier eating and try to move more; her other labs are stable; thank you

## 2018-03-05 NOTE — Telephone Encounter (Signed)
Thank you :)

## 2018-03-05 NOTE — Telephone Encounter (Signed)
Called pt informed her of results below. Gave pt all options, pt states that she DECLINES to begin a medication at this time, she would like to work on diet and exercise.

## 2018-03-08 ENCOUNTER — Ambulatory Visit
Admission: RE | Admit: 2018-03-08 | Discharge: 2018-03-08 | Disposition: A | Discharge status: Home / Self Care | Payer: Managed Care, Other (non HMO) | Source: Ambulatory Visit | Attending: Family Medicine | Admitting: Family Medicine

## 2018-03-08 ENCOUNTER — Other Ambulatory Visit: Payer: Self-pay | Admitting: Family Medicine

## 2018-03-08 DIAGNOSIS — R2230 Localized swelling, mass and lump, unspecified upper limb: Secondary | ICD-10-CM

## 2018-03-08 DIAGNOSIS — N6320 Unspecified lump in the left breast, unspecified quadrant: Secondary | ICD-10-CM | POA: Diagnosis present

## 2018-03-08 NOTE — Progress Notes (Signed)
gen surg referral

## 2018-03-11 ENCOUNTER — Encounter: Payer: Self-pay | Admitting: General Surgery

## 2018-04-04 ENCOUNTER — Encounter: Payer: Self-pay | Admitting: *Deleted

## 2018-04-09 ENCOUNTER — Encounter: Payer: Self-pay | Admitting: General Surgery

## 2018-04-09 ENCOUNTER — Ambulatory Visit: Payer: Self-pay | Admitting: General Surgery

## 2018-04-10 ENCOUNTER — Encounter: Payer: Self-pay | Admitting: *Deleted

## 2018-09-02 ENCOUNTER — Ambulatory Visit: Payer: Managed Care, Other (non HMO) | Admitting: Family Medicine

## 2018-09-02 ENCOUNTER — Encounter: Payer: Self-pay | Admitting: Family Medicine

## 2018-09-02 DIAGNOSIS — I10 Essential (primary) hypertension: Secondary | ICD-10-CM

## 2018-09-02 DIAGNOSIS — R5383 Other fatigue: Secondary | ICD-10-CM

## 2018-09-02 DIAGNOSIS — E782 Mixed hyperlipidemia: Secondary | ICD-10-CM

## 2018-09-02 DIAGNOSIS — R29898 Other symptoms and signs involving the musculoskeletal system: Secondary | ICD-10-CM

## 2018-09-02 DIAGNOSIS — R7303 Prediabetes: Secondary | ICD-10-CM | POA: Diagnosis not present

## 2018-09-02 DIAGNOSIS — R718 Other abnormality of red blood cells: Secondary | ICD-10-CM | POA: Diagnosis not present

## 2018-09-02 DIAGNOSIS — Z5181 Encounter for therapeutic drug level monitoring: Secondary | ICD-10-CM

## 2018-09-02 NOTE — Assessment & Plan Note (Signed)
Fair control today; continue meds; she'll work on weight loss on her own

## 2018-09-02 NOTE — Assessment & Plan Note (Signed)
Check lipids through [facility]; limit saturated fats; weight loss recommended

## 2018-09-02 NOTE — Assessment & Plan Note (Signed)
Check CBC today.  

## 2018-09-02 NOTE — Patient Instructions (Addendum)
Check out the information at familydoctor.org entitled "Nutrition for Weight Loss: What You Need to Know about Fad Diets" Try to lose between 1-2 pounds per week by taking in fewer calories and burning off more calories You can succeed by limiting portions, limiting foods dense in calories and fat, becoming more active, and drinking 8 glasses of water a day (64 ounces) Don't skip meals, especially breakfast, as skipping meals may alter your metabolism Do not use over-the-counter weight loss pills or gimmicks that claim rapid weight loss A healthy BMI (or body mass index) is between 18.5 and 24.9 You can calculate your ideal BMI at the [place] website ClubMonetize.fr   Obesity, Adult Obesity is the condition of having too much total body fat. Being overweight or obese means that your weight is greater than what is considered healthy for your body size. Obesity is determined by a measurement called BMI. BMI is an estimate of body fat and is calculated from height and weight. For adults, a BMI of 30 or higher is considered obese. Obesity can eventually lead to other health concerns and major illnesses, including:  Stroke.  Coronary artery disease (CAD).  Type 2 diabetes.  Some types of cancer, including cancers of the colon, breast, uterus, and gallbladder.  Osteoarthritis.  High blood pressure (hypertension).  High cholesterol.  Sleep apnea.  Gallbladder stones.  Infertility problems.  What are the causes? The main cause of obesity is taking in (consuming) more calories than your body uses for energy. Other factors that contribute to this condition may include:  Being born with genes that make you more likely to become obese.  Having a medical condition that causes obesity. These conditions include: ? Hypothyroidism. ? Polycystic ovarian syndrome (PCOS). ? Binge-eating disorder. ? Cushing syndrome.  Taking certain medicines,  such as steroids, antidepressants, and seizure medicines.  Not being physically active (sedentary lifestyle).  Living where there are limited places to exercise safely or buy healthy foods.  Not getting enough sleep.  What increases the risk? The following factors may increase your risk of this condition:  Having a family history of obesity.  Being a woman of African-American descent.  Being a man of Hispanic descent.  What are the signs or symptoms? Having excessive body fat is the main symptom of this condition. How is this diagnosed? This condition may be diagnosed based on:  Your symptoms.  Your medical history.  A physical exam. Your health care provider may measure: ? Your BMI. If you are an adult with a BMI between 25 and less than 30, you are considered overweight. If you are an adult with a BMI of 30 or higher, you are considered obese. ? The distances around your hips and your waist (circumferences). These may be compared to each other to help diagnose your condition. ? Your skinfold thickness. Your health care provider may gently pinch a fold of your skin and measure it.  How is this treated? Treatment for this condition often includes changing your lifestyle. Treatment may include some or all of the following:  Dietary changes. Work with your health care provider and a dietitian to set a weight-loss goal that is healthy and reasonable for you. Dietary changes may include eating: ? Smaller portions. A portion size is the amount of a particular food that is healthy for you to eat at one time. This varies from person to person. ? Low-calorie or low-fat options. ? More whole grains, fruits, and vegetables.  Regular physical activity. This may  include aerobic activity (cardio) and strength training.  Medicine to help you lose weight. Your health care provider may prescribe medicine if you are unable to lose 1 pound a week after 6 weeks of eating more healthily and  doing more physical activity.  Surgery. Surgical options may include gastric banding and gastric bypass. Surgery may be done if: ? Other treatments have not helped to improve your condition. ? You have a BMI of 40 or higher. ? You have life-threatening health problems related to obesity.  Follow these instructions at home:  Eating and drinking   Follow recommendations from your health care provider about what you eat and drink. Your health care provider may advise you to: ? Limit fast foods, sweets, and processed snack foods. ? Choose low-fat options, such as low-fat milk instead of whole milk. ? Eat 5 or more servings of fruits or vegetables every day. ? Eat at home more often. This gives you more control over what you eat. ? Choose healthy foods when you eat out. ? Learn what a healthy portion size is. ? Keep low-fat snacks on hand. ? Avoid sugary drinks, such as soda, fruit juice, iced tea sweetened with sugar, and flavored milk. ? Eat a healthy breakfast.  Drink enough water to keep your urine clear or pale yellow.  Do not go without eating for long periods of time (do not fast) or follow a fad diet. Fasting and fad diets can be unhealthy and even dangerous. Physical Activity  Exercise regularly, as told by your health care provider. Ask your health care provider what types of exercise are safe for you and how often you should exercise.  Warm up and stretch before being active.  Cool down and stretch after being active.  Rest between periods of activity. Lifestyle  Limit the time that you spend in front of your TV, computer, or video game system.  Find ways to reward yourself that do not involve food.  Limit alcohol intake to no more than 1 drink a day for nonpregnant women and 2 drinks a day for men. One drink equals 12 oz of beer, 5 oz of wine, or 1 oz of hard liquor. General instructions  Keep a weight loss journal to keep track of the food you eat and how much you  exercise you get.  Take over-the-counter and prescription medicines only as told by your health care provider.  Take vitamins and supplements only as told by your health care provider.  Consider joining a support group. Your health care provider may be able to recommend a support group.  Keep all follow-up visits as told by your health care provider. This is important. Contact a health care provider if:  You are unable to meet your weight loss goal after 6 weeks of dietary and lifestyle changes. This information is not intended to replace advice given to you by your health care provider. Make sure you discuss any questions you have with your health care provider. Document Released: 12/07/2004 Document Revised: 04/03/2016 Document Reviewed: 08/18/2015 Elsevier Interactive Patient Education  2018 [place].  Preventing Unhealthy [employer], Adult Staying at a healthy weight is important. When fat builds up in your body, you may become overweight or obese. These conditions put you at greater risk for developing certain health problems, such as heart disease, diabetes, sleeping problems, joint problems, and some cancers. Unhealthy weight gain is often the result of making unhealthy choices in what you eat. It is also a result of  not getting enough exercise. You can make changes to your lifestyle to prevent obesity and stay as healthy as possible. What nutrition changes can be made? To maintain a healthy weight and prevent obesity:  Eat only as much as your body needs. To do this: ? Pay attention to signs that you are hungry or full. Stop eating as soon as you feel full. ? If you feel hungry, try drinking water first. Drink enough water so your urine is clear or pale yellow. ? Eat smaller portions. ? Look at serving sizes on food labels. Most foods contain more than one serving per container. ? Eat the recommended amount of calories for your gender and activity level. While most active  people should eat around 2,000 calories per day, if you are trying to lose weight or are not very active, you main need to eat less calories. Talk to your health care provider or dietitian about how many calories you should eat each day.  Choose healthy foods, such as: ? Fruits and vegetables. Try to fill at least half of your plate at each meal with fruits and vegetables. ? Whole grains, such as whole wheat bread, brown rice, and quinoa. ? Lean meats, such as chicken or fish. ? Other healthy proteins, such as beans, eggs, or tofu. ? Healthy fats, such as nuts, seeds, fatty fish, and olive oil. ? Low-fat or fat-free dairy.  Check food labels and avoid food and drinks that: ? Are high in calories. ? Have added sugar. ? Are high in sodium. ? Have saturated fats or trans fats.  Limit how much you eat of the following foods: ? Prepackaged meals. ? Fast food. ? Fried foods. ? Processed meat, such as bacon, sausage, and deli meats. ? Fatty cuts of red meat and poultry with skin.  Cook foods in healthier ways, such as by baking, broiling, or grilling.  When grocery shopping, try to shop around the outside of the store. This helps you buy mostly fresh foods and avoid canned and prepackaged foods.  What lifestyle changes can be made?  Exercise at least 30 minutes 5 or more days each week. Exercising includes brisk walking, yard work, biking, running, swimming, and team sports like basketball and soccer. Ask your health care provider which exercises are safe for you.  Do not use any products that contain nicotine or tobacco, such as cigarettes and e-cigarettes. If you need help quitting, ask your health care provider.  Limit alcohol intake to no more than 1 drink a day for nonpregnant women and 2 drinks a day for men. One drink equals 12 oz of beer, 5 oz of wine, or 1 oz of hard liquor.  Try to get 7-9 hours of sleep each night. What other changes can be made?  Keep a food and activity  journal to keep track of: ? What you ate and how many calories you had. Remember to count sauces, dressings, and side dishes. ? Whether you were active, and what exercises you did. ? Your calorie, weight, and activity goals.  Check your weight regularly. Track any changes. If you notice you have gained weight, make changes to your diet or activity routine.  Avoid taking weight-loss medicines or supplements. Talk to your health care provider before starting any new medicine or supplement.  Talk to your health care provider before trying any new diet or exercise plan. Why are these changes important? Eating healthy, staying active, and having healthy habits not only help prevent obesity, they  also:  Help you to manage stress and emotions.  Help you to connect with friends and family.  Improve your self-esteem.  Improve your sleep.  Prevent long-term health problems.  What can happen if changes are not made? Being obese or overweight can cause you to develop joint or bone problems, which can make it hard for you to stay active or do activities you enjoy. Being obese or overweight also puts stress on your heart and lungs and can lead to health problems like diabetes, heart disease, and some cancers. Where to find more information: Talk with your health care provider or a dietitian about healthy eating and healthy lifestyle choices. You may also find other information through these resources:  U.S. Department of Agriculture MyPlate: https://ball-collins.biz/  American Heart Association: www.heart.org  Centers for Disease Control and Prevention: FootballExhibition.com.br  Summary  Staying at a healthy weight is important. It helps prevent certain diseases and health problems, such as heart disease, diabetes, joint problems, sleep disorders, and some cancers.  Being obese or overweight can cause you to develop joint or bone problems, which can make it hard for you to stay active or do activities you  enjoy.  You can prevent unhealthy weight gain by eating a healthy diet, exercising regularly, not smoking, limiting alcohol, and getting enough sleep.  Talk with your health care provider or a dietitian for guidance about healthy eating and healthy lifestyle choices. This information is not intended to replace advice given to you by your health care provider. Make sure you discuss any questions you have with your health care provider. Document Released: 10/31/2016 Document Revised: 12/06/2016 Document Reviewed: 12/06/2016 Elsevier Interactive Patient Education  2018 [employer].  Try to limit saturated fats in your diet (bologna, hot dogs, barbeque, cheeseburgers, hamburgers, steak, bacon, sausage, cheese, etc.) and get more fresh fruits, vegetables, and whole grains

## 2018-09-02 NOTE — Assessment & Plan Note (Signed)
Encouraged weight loss; discussed either tracking calories in using MyFitnessPal or other app plus tracking calories out using fitbit or garmin, OR she could limit portions and start walking 10-15 minutes a day to decrease calories in and increase calories burned off; I cannot endorse "juicing" as a means to lose weight; nothing in particular special about grinding up the fruits and vegetables versus just eating them; encouraged health eating; referral to nutritionist encouraged and she says she will talk to one at work; I do not have a medical reason to sign her appear this year, since we worked on this together last year; she should be down 26-52 pounds based on our agreement last year

## 2018-09-02 NOTE — Progress Notes (Signed)
BP 132/82   Pulse 74   Temp 98.7 F (37.1 C)   Ht 5' (1.524 m)   Wt 217 lb 14.4 oz (98.8 kg)   LMP 08/04/2018 (Exact Date)   SpO2 96%   BMI 42.56 kg/m    Subjective:    Patient ID: Alexis Moody, female    DOB: [birth year], 42 y.o.   MRN: [id number]  HPI: Alexis Moody is a 42 y.o. female  Chief Complaint  Patient presents with  . Follow-up    HPI Patient is here for her obesity; she is asking for another appear because she did not meet the BMI criteria again this year We did an appeal for her obesity last year; reviewed August 2018 She is replacing two meals a day with juices, just vegetables and spinach and apple She is not sure about how many calories are in the juice Cucumber, spinach, orange or apple, celery; main veggies Other meal needs improvement We need to do the healthier version, not so healthy meals She eats meat; baked chicken or fried, mainly chicken; not much red meat at all Likes cheese Mac and cheese, not much other pasta Has not used an app recently to lose weight She will call nutritionist at work she says because that is free  Normal TSH in April  Prediabetes; father has diabetes; probably adult onset; no dry mouth; no blurred vision  High LDL, 140; had been 158; baked chicken, some cheese; gets honey wheat bread  She can go up the stairs at home and her right knee is clicking; going on for 2 months; swelling in the ankles; no injuries recalled; nothing tried  Depression screen [facility] 2/9 09/02/2018 02/22/2018 07/02/2017 05/24/2017 08/21/2016  Decreased Interest 0 0 0 0 0  Down, Depressed, Hopeless 0 0 0 0 0  PHQ - 2 Score 0 0 0 0 0  Altered sleeping 0 - - - -  Tired, decreased energy 0 - - - -  Change in appetite 0 - - - -  Feeling bad or failure about yourself  0 - - - -  Trouble concentrating 0 - - - -  Moving slowly or fidgety/restless 0 - - - -  Suicidal thoughts 0 - - - -  PHQ-9 Score 0 - - - -  Difficult doing work/chores Not difficult at all  - - - -   Fall Risk  09/02/2018 02/22/2018 07/02/2017 05/24/2017 08/21/2016  Falls in the past year? _0     Relevant past medical, surgical, family and social history reviewed Past Medical History:  Diagnosis Date  . Hyperlipidemia   . Hypertension   . Obesity   . Obesity, Class II, BMI 35-39.9, with comorbidity    Past Surgical History:  Procedure Laterality Date  . REDUCTION MAMMAPLASTY     Family History  Problem Relation Age of Onset  . Hyperlipidemia Mother   . Hypertension Mother   . Breast cancer Mother 73  . Hyperlipidemia Father   . Hypertension Father   . Diabetes Father   . Hypertension Maternal Grandfather   . Multiple sclerosis Maternal Grandmother   . Leukemia Cousin   . Stroke Neg Hx   . Heart disease Neg Hx    Social History   Tobacco Use  . Smoking status: Former Research scientist (life sciences)  . Smokeless tobacco: Never Used  Substance Use Topics  . Alcohol use: Yes    Alcohol/week: 0.0 standard drinks    Comment: occasional  . Drug use: No  Office Visit from 09/02/2018 in [facility]  AUDIT-C Score  2      Interim medical history since last visit reviewed. Allergies and medications reviewed  Review of Systems Per HPI unless specifically indicated above     Objective:    BP 132/82   Pulse 74   Temp 98.7 F (37.1 C)   Ht 5' (1.524 m)   Wt 217 lb 14.4 oz (98.8 kg)   LMP 08/04/2018 (Exact Date)   SpO2 96%   BMI 42.56 kg/m   Wt Readings from Last 3 Encounters:  09/02/18 217 lb 14.4 oz (98.8 kg)  02/22/18 217 lb 6.4 oz (98.6 kg)  07/02/17 220 lb 3.2 oz (99.9 kg)    Physical Exam  Constitutional: She appears well-developed and well-nourished. No distress.  HENT:  Head: Normocephalic and atraumatic.  Eyes: EOM are normal. No scleral icterus.  Neck: No thyromegaly present.  Cardiovascular: Normal rate, regular rhythm and normal heart sounds.  No murmur heard. Pulmonary/Chest: Effort normal and breath sounds normal.    Abdominal: Soft. Bowel sounds are normal. She exhibits no distension.  Musculoskeletal: Normal range of motion. She exhibits no edema.       Right knee: She exhibits no deformity.       Left knee: She exhibits no deformity.  Mild crepitus and clicking with active flexion / extension of each knee  Neurological: She is alert.  Skin: Skin is warm and dry. She is not diaphoretic. No pallor.  Psychiatric: She has a normal mood and affect. Her behavior is normal. Judgment and thought content normal. Her mood appears not anxious. She does not exhibit a depressed mood.   Results for orders placed or performed in visit on 02/22/18  Hemoglobin A1c  Result Value Ref Range   Hgb A1c MFr Bld 6.1 (H) 4.8 - 5.6 %   Est. average glucose Bld gHb Est-mCnc 128 mg/dL  Lipid panel  Result Value Ref Range   Cholesterol, Total 197 100 - 199 mg/dL   Triglycerides 70 0 - 149 mg/dL   HDL 43 >39 mg/dL   VLDL Cholesterol Cal 14 5 - 40 mg/dL   LDL Calculated 140 (H) 0 - 99 mg/dL   Chol/HDL Ratio 4.6 (H) 0.0 - 4.4 ratio  CBC with Differential/Platelet  Result Value Ref Range   WBC 5.8 3.4 - 10.8 x10E3/uL   RBC 4.45 3.77 - 5.28 x10E6/uL   Hemoglobin 11.7 11.1 - 15.9 g/dL   Hematocrit 36.2 34.0 - 46.6 %   MCV 81 79 - 97 fL   MCH 26.3 (L) 26.6 - 33.0 pg   MCHC 32.3 31.5 - 35.7 g/dL   RDW 15.5 (H) 12.3 - 15.4 %   Platelets 234 150 - 379 x10E3/uL   Neutrophils 46 Not Estab. %   Lymphs 40 Not Estab. %   Monocytes 8 Not Estab. %   Eos 5 Not Estab. %   Basos 1 Not Estab. %   Neutrophils Absolute 2.7 1.4 - 7.0 x10E3/uL   Lymphocytes Absolute 2.3 0.7 - 3.1 x10E3/uL   Monocytes Absolute 0.5 0.1 - 0.9 x10E3/uL   EOS (ABSOLUTE) 0.3 0.0 - 0.4 x10E3/uL   Basophils Absolute 0.0 0.0 - 0.2 x10E3/uL   Immature Granulocytes 0 Not Estab. %   Immature Grans (Abs) 0.0 0.0 - 0.1 x10E3/uL  Comprehensive metabolic panel  Result Value Ref Range   Glucose 94 65 - 99 mg/dL   BUN 14 6 - 24 mg/dL   Creatinine, Ser 0.96  0.57 -  1.00 mg/dL   GFR calc non Af Amer 74 >59 mL/min/1.73   GFR calc Af Amer 86 >59 mL/min/1.73   BUN/Creatinine Ratio 15 9 - 23   Sodium 140 134 - 144 mmol/L   Potassium 3.7 3.5 - 5.2 mmol/L   Chloride 101 96 - 106 mmol/L   CO2 28 20 - 29 mmol/L   Calcium 9.1 8.7 - 10.2 mg/dL   Total Protein 6.5 6.0 - 8.5 g/dL   Albumin 3.9 3.5 - 5.5 g/dL   Globulin, Total 2.6 1.5 - 4.5 g/dL   Albumin/Globulin Ratio 1.5 1.2 - 2.2   Bilirubin Total 0.4 0.0 - 1.2 mg/dL   Alkaline Phosphatase 55 39 - 117 IU/L   AST 10 0 - 40 IU/L   ALT 11 0 - 32 IU/L  TSH  Result Value Ref Range   TSH 0.796 0.450 - 4.500 uIU/mL  Ferritin  Result Value Ref Range   Ferritin 51 15 - 150 ng/mL  POCT urinalysis dipstick  Result Value Ref Range   Color, UA yellow    Clarity, UA clear    Glucose, UA neg    Bilirubin, UA neg    Ketones, UA neg    Spec Grav, UA 1.020 1.010 - 1.025   Blood, UA neg    pH, UA 6.0 5.0 - 8.0   Protein, UA neg    Urobilinogen, UA 0.2 0.2 or 1.0 E.U./dL   Nitrite, UA neg    Leukocytes, UA Negative Negative   Appearance normal    Odor none       Assessment & Plan:   Problem List Items Addressed This Visit      Cardiovascular and Mediastinum   Hypertension goal BP (blood pressure) < 140/90    Fair control today; continue meds; she'll work on weight loss on her own        Other   Fatigue   Relevant Orders   CBC with Differential/Platelet   TSH   Medication monitoring encounter   Relevant Orders   Comprehensive metabolic panel   Pre-diabetes (Chronic)    Check A1c through [facility]; weight loss should help slow progression to type 2 DM      Relevant Orders   Hemoglobin A1c   Morbid obesity (HCC) - Primary (Chronic)    Encouraged weight loss; discussed either tracking calories in using MyFitnessPal or other app plus tracking calories out using fitbit or garmin, OR she could limit portions and start walking 10-15 minutes a day to decrease calories in and increase calories burned  off; I cannot endorse "juicing" as a means to lose weight; nothing in particular special about grinding up the fruits and vegetables versus just eating them; encouraged health eating; referral to nutritionist encouraged and she says she will talk to one at work; I do not have a medical reason to sign her appear this year, since we worked on this together last year; she should be down 26-52 pounds based on our agreement last year      Microcytosis    Check CBC today      Relevant Orders   CBC with Differential/Platelet   HLD (hyperlipidemia)    Check lipids through [facility]; limit saturated fats; weight loss recommended      Relevant Orders   Lipid panel    Other Visit Diagnoses    Knee clicking       Relevant Orders   Ambulatory referral to Orthopedic Surgery  Follow up plan: Return in about 6 months (around 03/04/2019) for follow-up visit with Dr. Sanda Klein.  An after-visit summary was printed and given to the patient at [facility].  Please see the patient instructions which may contain other information and recommendations beyond what is mentioned above in the assessment and plan.  No orders of the defined types were placed in this encounter.   Orders Placed This Encounter  Procedures  . Hemoglobin A1c  . CBC with Differential/Platelet  . Lipid panel  . TSH  . Comprehensive metabolic panel  . Ambulatory referral to Orthopedic Surgery

## 2018-09-02 NOTE — Assessment & Plan Note (Signed)
Check A1c through [facility]; weight loss should help slow progression to type 2 DM

## 2018-12-29 ENCOUNTER — Other Ambulatory Visit: Payer: Self-pay | Admitting: Family Medicine

## 2018-12-29 DIAGNOSIS — I10 Essential (primary) hypertension: Secondary | ICD-10-CM

## 2019-01-01 ENCOUNTER — Other Ambulatory Visit: Payer: Self-pay | Admitting: Family Medicine

## 2019-01-01 DIAGNOSIS — I10 Essential (primary) hypertension: Secondary | ICD-10-CM

## 2019-01-02 ENCOUNTER — Other Ambulatory Visit: Payer: Self-pay | Admitting: Family Medicine

## 2019-01-02 DIAGNOSIS — I10 Essential (primary) hypertension: Secondary | ICD-10-CM

## 2019-02-27 ENCOUNTER — Other Ambulatory Visit: Payer: Self-pay | Admitting: Family Medicine

## 2019-02-27 DIAGNOSIS — I10 Essential (primary) hypertension: Secondary | ICD-10-CM

## 2019-02-28 NOTE — Telephone Encounter (Signed)
It appears that the patient never had her labs done in October She has an appt on Monday I'll refill med this time and discuss w/her at appt

## 2019-03-03 ENCOUNTER — Telehealth: Payer: Self-pay

## 2019-03-03 ENCOUNTER — Encounter: Payer: Self-pay | Admitting: Family Medicine

## 2019-03-03 ENCOUNTER — Ambulatory Visit (INDEPENDENT_AMBULATORY_CARE_PROVIDER_SITE_OTHER): Payer: Managed Care, Other (non HMO) | Admitting: Family Medicine

## 2019-03-03 ENCOUNTER — Other Ambulatory Visit: Payer: Self-pay | Admitting: Family Medicine

## 2019-03-03 ENCOUNTER — Other Ambulatory Visit: Payer: Self-pay

## 2019-03-03 VITALS — Ht 60.0 in | Wt 206.0 lb

## 2019-03-03 DIAGNOSIS — E559 Vitamin D deficiency, unspecified: Secondary | ICD-10-CM

## 2019-03-03 DIAGNOSIS — Z5181 Encounter for therapeutic drug level monitoring: Secondary | ICD-10-CM

## 2019-03-03 DIAGNOSIS — R718 Other abnormality of red blood cells: Secondary | ICD-10-CM | POA: Diagnosis not present

## 2019-03-03 DIAGNOSIS — I10 Essential (primary) hypertension: Secondary | ICD-10-CM

## 2019-03-03 DIAGNOSIS — R7303 Prediabetes: Secondary | ICD-10-CM

## 2019-03-03 DIAGNOSIS — E782 Mixed hyperlipidemia: Secondary | ICD-10-CM

## 2019-03-03 DIAGNOSIS — R239 Unspecified skin changes: Secondary | ICD-10-CM

## 2019-03-03 NOTE — Progress Notes (Signed)
Ht 5' (1.524 m)   Wt 206 lb (93.4 kg)   LMP 02/13/2019 (Exact Date)   BMI 40.23 kg/m    Subjective:    Patient ID: Alexis Moody, female    DOB: 1976-01-26, 43 y.o.   MRN: [id number]  HPI: Alexis Moody is a 43 y.o. female  Chief Complaint  Patient presents with  . Follow-up  . Skin Problem    Left leg    HPI Virtual Visit via Telephone/Video Note   I connected with the patient via:  D[employer] VIDEO I verified that I am speaking with the correct person using two identifiers.  Call started: 1:31 pm Call terminated: 1:49 pm Total length of call: 18 minutes   I discussed the limitations, risks, and privacy concerns of performing an evaluation and management service by telephone and the availability of in-person appointments. I explained that he/she may be responsible for charges related to this service. The patient expressed understanding and agreed to proceed.  Provider location: home, upstairs office with door closed, earphones/headset on Patient location: home Additional participants: husband is in kitchen   She says she has a dark area on the leg; like a bruise; does not remember hitting her leg; maybe a couple of months; maybe longer; no pain or soreness; no trauma;   Hypertension Not able to check her BP; however no batteries; trying to limit salt  Prediabetes; no dry mouth, no blurred vision; father has diabetes  Lab Results  Component Value Date   HGBA1C 6.1 (H) 03/01/2018    Hyperlipidemia; trying to stay away fromfatty meats and cheeses  Morbid obesity; she is losing weight down 11 pounds; she is walking from her car more; does not want to see nutritionist on her own; will get more exercise in; no dyspnea or chest pain; she is noticing better conditioning  Microcytosis; pretty good eater Taking iron three times a week; no constipation  Lab Results  Component Value Date   WBC 5.8 03/01/2018   HGB 11.7 03/01/2018   HCT 36.2 03/01/2018   MCV 81 03/01/2018    PLT 234 03/01/2018     Depression screen PHQ 2/9 09/02/2018 02/22/2018 07/02/2017 05/24/2017 08/21/2016  Decreased Interest 0 0 0 0 0  Down, Depressed, Hopeless 0 0 0 0 0  PHQ - 2 Score 0 0 0 0 0  Altered sleeping 0 - - - -  Tired, decreased energy 0 - - - -  Change in appetite 0 - - - -  Feeling bad or failure about yourself  0 - - - -  Trouble concentrating 0 - - - -  Moving slowly or fidgety/restless 0 - - - -  Suicidal thoughts 0 - - - -  PHQ-9 Score 0 - - - -  Difficult doing work/chores Not difficult at all - - - -   Fall Risk  03/03/2019 09/02/2018 02/22/2018 07/02/2017 05/24/2017  Falls in the past year? 0 No No No No    Relevant past medical, surgical, family and social history reviewed Past Medical History:  Diagnosis Date  . Hyperlipidemia   . Hypertension   . Obesity   . Obesity, Class II, BMI 35-39.9, with comorbidity    Past Surgical History:  Procedure Laterality Date  . REDUCTION MAMMAPLASTY     Family History  Problem Relation Age of Onset  . Hyperlipidemia Mother   . Hypertension Mother   . Breast cancer Mother 47  . Hyperlipidemia Father   . Hypertension Father   .  Diabetes Father   . Hypertension Maternal Grandfather   . Multiple sclerosis Maternal Grandmother   . Leukemia Cousin   . Stroke Neg Hx   . Heart disease Neg Hx    Social History   Tobacco Use  . Smoking status: Former Smoker    Start date: 11/13/1994    Last attempt to quit: 11/14/1999    Years since quitting: 19.3  . Smokeless tobacco: Never Used  Substance Use Topics  . Alcohol use: Yes    Alcohol/week: 0.0 standard drinks    Comment: occasional  . Drug use: No     Office Visit from 03/03/2019 in [facility]  AUDIT-C Score  1      Interim medical history since last visit reviewed. Allergies and medications reviewed  Review of Systems Per HPI unless specifically indicated above     Objective:    Ht 5' (1.524 m)   Wt 206 lb (93.4 kg)   LMP 02/13/2019  (Exact Date)   BMI 40.23 kg/m   Wt Readings from Last 3 Encounters:  03/03/19 206 lb (93.4 kg)  09/02/18 217 lb 14.4 oz (98.8 kg)  02/22/18 217 lb 6.4 oz (98.6 kg)    Physical Exam  Results for orders placed or performed in visit on 02/22/18  Hemoglobin A1c  Result Value Ref Range   Hgb A1c MFr Bld 6.1 (H) 4.8 - 5.6 %   Est. average glucose Bld gHb Est-mCnc 128 mg/dL  Lipid panel  Result Value Ref Range   Cholesterol, Total 197 100 - 199 mg/dL   Triglycerides 70 0 - 149 mg/dL   HDL 43 >32>39 mg/dL   VLDL Cholesterol Cal 14 5 - 40 mg/dL   LDL Calculated 440140 (H) 0 - 99 mg/dL   Chol/HDL Ratio 4.6 (H) 0.0 - 4.4 ratio  CBC with Differential/Platelet  Result Value Ref Range   WBC 5.8 3.4 - 10.8 x10E3/uL   RBC 4.45 3.77 - 5.28 x10E6/uL   Hemoglobin 11.7 11.1 - 15.9 g/dL   Hematocrit 10.236.2 72.534.0 - 46.6 %   MCV 81 79 - 97 fL   MCH 26.3 (L) 26.6 - 33.0 pg   MCHC 32.3 31.5 - 35.7 g/dL   RDW 36.615.5 (H) 44.012.3 - 34.715.4 %   Platelets 234 150 - 379 x10E3/uL   Neutrophils 46 Not Estab. %   Lymphs 40 Not Estab. %   Monocytes 8 Not Estab. %   Eos 5 Not Estab. %   Basos 1 Not Estab. %   Neutrophils Absolute 2.7 1.4 - 7.0 x10E3/uL   Lymphocytes Absolute 2.3 0.7 - 3.1 x10E3/uL   Monocytes Absolute 0.5 0.1 - 0.9 x10E3/uL   EOS (ABSOLUTE) 0.3 0.0 - 0.4 x10E3/uL   Basophils Absolute 0.0 0.0 - 0.2 x10E3/uL   Immature Granulocytes 0 Not Estab. %   Immature Grans (Abs) 0.0 0.0 - 0.1 x10E3/uL  Comprehensive metabolic panel  Result Value Ref Range   Glucose 94 65 - 99 mg/dL   BUN 14 6 - 24 mg/dL   Creatinine, Ser 4.250.96 0.57 - 1.00 mg/dL   GFR calc non Af Amer 74 >59 mL/min/1.73   GFR calc Af Amer 86 >59 mL/min/1.73   BUN/Creatinine Ratio 15 9 - 23   Sodium 140 134 - 144 mmol/L   Potassium 3.7 3.5 - 5.2 mmol/L   Chloride 101 96 - 106 mmol/L   CO2 28 20 - 29 mmol/L   Calcium 9.1 8.7 - 10.2 mg/dL   Total Protein 6.5  6.0 - 8.5 g/dL   Albumin 3.9 3.5 - 5.5 g/dL   Globulin, Total 2.6 1.5 - 4.5 g/dL    Albumin/Globulin Ratio 1.5 1.2 - 2.2   Bilirubin Total 0.4 0.0 - 1.2 mg/dL   Alkaline Phosphatase 55 39 - 117 IU/L   AST 10 0 - 40 IU/L   ALT 11 0 - 32 IU/L  TSH  Result Value Ref Range   TSH 0.796 0.450 - 4.500 uIU/mL  Ferritin  Result Value Ref Range   Ferritin 51 15 - 150 ng/mL  POCT urinalysis dipstick  Result Value Ref Range   Color, UA yellow    Clarity, UA clear    Glucose, UA neg    Bilirubin, UA neg    Ketones, UA neg    Spec Grav, UA 1.020 1.010 - 1.025   Blood, UA neg    pH, UA 6.0 5.0 - 8.0   Protein, UA neg    Urobilinogen, UA 0.2 0.2 or 1.0 E.U./dL   Nitrite, UA neg    Leukocytes, UA Negative Negative   Appearance normal    Odor none       Assessment & Plan:   Problem List Items Addressed This Visit      Cardiovascular and Mediastinum   Hypertension goal BP (blood pressure) < 140/90    As she loses weight, expect to reduce BP pill; she'll call with readings if trending down; avoid salt        Other   Medication monitoring encounter   Relevant Orders   Comprehensive metabolic panel   Pre-diabetes (Chronic)    Check A1c; proud of her weight loss efforts      Relevant Orders   Hemoglobin A1c   Morbid obesity (HCC) (Chronic)    Working on weight loss; will see her back in 3 months; she may be able to decrese antihypertensive if weight comes down enough; monitor BP and contact me if needed before next appt if pressures lower      Microcytosis    Check CBC, iron studies      Relevant Orders   CBC   Iron, TIBC and Ferritin Panel   HLD (hyperlipidemia)    Check lipid panel; she is working on weight loss      Relevant Orders   Lipid panel    Other Visit Diagnoses    Skin change    -  Primary   refer to dermatologist   Relevant Orders   Ambulatory referral to Dermatology   Vitamin D deficiency       Relevant Orders   VITAMIN D 25 Hydroxy (Vit-D Deficiency, Fractures)      Follow up plan: Return in about 3 months (around 06/02/2019) for  follow-up visit with Dr. Sherie Don.  No orders of the defined types were placed in this encounter.   Orders Placed This Encounter  Procedures  . CBC  . Lipid panel  . Iron, TIBC and Ferritin Panel  . Comprehensive metabolic panel  . Hemoglobin A1c  . VITAMIN D 25 Hydroxy (Vit-D Deficiency, Fractures)  . Ambulatory referral to Dermatology

## 2019-03-03 NOTE — Assessment & Plan Note (Signed)
Working on weight loss; will see her back in 3 months; she may be able to decrese antihypertensive if weight comes down enough; monitor BP and contact me if needed before next appt if pressures lower

## 2019-03-03 NOTE — Assessment & Plan Note (Signed)
Check lipid panel; she is working on weight loss

## 2019-03-03 NOTE — Assessment & Plan Note (Signed)
Check A1c; proud of her weight loss efforts

## 2019-03-03 NOTE — Assessment & Plan Note (Signed)
Check CBC, iron studies 

## 2019-03-03 NOTE — Assessment & Plan Note (Signed)
As she loses weight, expect to reduce BP pill; she'll call with readings if trending down; avoid salt

## 2019-03-04 NOTE — Telephone Encounter (Signed)
Lab Results  Component Value Date   CREATININE 0.96 03/01/2018   Lab Results  Component Value Date   K 3.7 03/01/2018   30 days approved yesterday She needs labs Request for 90 days was denied

## 2019-03-12 NOTE — Telephone Encounter (Signed)
Erroneous entry

## 2019-03-31 ENCOUNTER — Telehealth: Payer: Self-pay | Admitting: Family Medicine

## 2019-03-31 ENCOUNTER — Other Ambulatory Visit: Payer: Self-pay | Admitting: Family Medicine

## 2019-03-31 DIAGNOSIS — I10 Essential (primary) hypertension: Secondary | ICD-10-CM

## 2019-03-31 NOTE — Telephone Encounter (Signed)
Copied from CRM [id number]. Topic: Quick Communication - Rx Refill/Question >> Mar 31, 2019  2:53 PM Dalphine Handing A wrote: Medication: hydrochlorothiazide (HYDRODIURIL) 25 MG tablet(Pharmacy called and stated that patients insurance will cover medication for 90 day supply only.)  Has the patient contacted their pharmacy? Yes (Agent: If no, request that the patient contact the pharmacy for the refill.) (Agent: If yes, when and what did the pharmacy advise?)Contact PCP  Preferred Pharmacy (with phone number or street name): [id number] [place], [place] - [address] AT [facility] [phone] (Phone) [phone] (Fax)    Agent: Please be advised that RX refills may take up to 3 business days. We ask that you follow-up with your pharmacy.

## 2019-03-31 NOTE — Telephone Encounter (Signed)
Already sent in today.

## 2019-04-01 ENCOUNTER — Other Ambulatory Visit: Payer: Self-pay | Admitting: Nurse Practitioner

## 2019-04-01 DIAGNOSIS — I10 Essential (primary) hypertension: Secondary | ICD-10-CM

## 2019-04-01 MED ORDER — HYDROCHLOROTHIAZIDE 25 MG PO TABS: 25.0000 mg | ORAL_TABLET | Freq: Every day | ORAL | 0 refills | Status: DC

## 2019-06-02 ENCOUNTER — Ambulatory Visit: Payer: Managed Care, Other (non HMO) | Admitting: Family Medicine

## 2019-06-30 ENCOUNTER — Other Ambulatory Visit: Payer: Self-pay | Admitting: Nurse Practitioner

## 2019-06-30 DIAGNOSIS — I10 Essential (primary) hypertension: Secondary | ICD-10-CM

## 2019-07-07 ENCOUNTER — Ambulatory Visit: Payer: Managed Care, Other (non HMO) | Admitting: Nurse Practitioner

## 2019-07-07 ENCOUNTER — Encounter: Payer: Self-pay | Admitting: Nurse Practitioner

## 2019-07-07 ENCOUNTER — Other Ambulatory Visit: Payer: Self-pay

## 2019-07-07 VITALS — BP 122/86 | HR 96 | Temp 96.8°F | Resp 14 | Ht 61.0 in | Wt 212.7 lb

## 2019-07-07 DIAGNOSIS — E782 Mixed hyperlipidemia: Secondary | ICD-10-CM

## 2019-07-07 DIAGNOSIS — I10 Essential (primary) hypertension: Secondary | ICD-10-CM | POA: Diagnosis not present

## 2019-07-07 DIAGNOSIS — R7303 Prediabetes: Secondary | ICD-10-CM

## 2019-07-07 DIAGNOSIS — R718 Other abnormality of red blood cells: Secondary | ICD-10-CM

## 2019-07-07 NOTE — Patient Instructions (Signed)
- Make appointment with dietician through employer - Walking for 45 minutes 3 days a week, start some light weight lifting- 2 days a week.  - My fitness pal- documenting water and calorie intake  - Increase water to 64 ounces daily ( 4 16 ounce water bottles)    Calorie Counting for Weight Loss Calories are units of energy. Your body needs a certain amount of calories from food to keep you going throughout the day. When you eat more calories than your body needs, your body stores the extra calories as fat. When you eat fewer calories than your body needs, your body burns fat to get the energy it needs. Calorie counting means keeping track of how many calories you eat and drink each day. Calorie counting can be helpful if you need to lose weight. If you make sure to eat fewer calories than your body needs, you should lose weight. Ask your health care provider what a healthy weight is for you. For calorie counting to work, you will need to eat the right number of calories in a day in order to lose a healthy amount of weight per week. A dietitian can help you determine how many calories you need in a day and will give you suggestions on how to reach your calorie goal.  A healthy amount of weight to lose per week is usually 1-2 lb (0.5-0.9 kg). This usually means that your daily calorie intake should be reduced by 500-750 calories.  Eating 1,200 - 1,500 calories per day can help most women lose weight.  Eating 1,500 - 1,800 calories per day can help most men lose weight. What is my plan? My goal is to have __________ calories per day. If I have this many calories per day, I should lose around __________ pounds per week. What do I need to know about calorie counting? In order to meet your daily calorie goal, you will need to:  Find out how many calories are in each food you would like to eat. Try to do this before you eat.  Decide how much of the food you plan to eat.  Write down what you ate  and how many calories it had. Doing this is called keeping a food log. To successfully lose weight, it is important to balance calorie counting with a healthy lifestyle that includes regular activity. Aim for 150 minutes of moderate exercise (such as walking) or 75 minutes of vigorous exercise (such as running) each week. Where do I find calorie information?  The number of calories in a food can be found on a Nutrition Facts label. If a food does not have a Nutrition Facts label, try to look up the calories online or ask your dietitian for help. Remember that calories are listed per serving. If you choose to have more than one serving of a food, you will have to multiply the calories per serving by the amount of servings you plan to eat. For example, the label on a package of bread might say that a serving size is 1 slice and that there are 90 calories in a serving. If you eat 1 slice, you will have eaten 90 calories. If you eat 2 slices, you will have eaten 180 calories. How do I keep a food log? Immediately after each meal, record the following information in your food log:  What you ate. Don't forget to include toppings, sauces, and other extras on the food.  How much you ate. This can be  measured in cups, ounces, or number of items.  How many calories each food and drink had.  The total number of calories in the meal. Keep your food log near you, such as in a small notebook in your pocket, or use a mobile app or website. Some programs will calculate calories for you and show you how many calories you have left for the day to meet your goal. What are some calorie counting tips?   Use your calories on foods and drinks that will fill you up and not leave you hungry: ? Some examples of foods that fill you up are nuts and nut butters, vegetables, lean proteins, and high-fiber foods like whole grains. High-fiber foods are foods with more than 5 g fiber per serving. ? Drinks such as sodas,  specialty coffee drinks, alcohol, and juices have a lot of calories, yet do not fill you up.  Eat nutritious foods and avoid empty calories. Empty calories are calories you get from foods or beverages that do not have many vitamins or protein, such as candy, sweets, and soda. It is better to have a nutritious high-calorie food (such as an avocado) than a food with few nutrients (such as a bag of chips).  Know how many calories are in the foods you eat most often. This will help you calculate calorie counts faster.  Pay attention to calories in drinks. Low-calorie drinks include water and unsweetened drinks.  Pay attention to nutrition labels for "low fat" or "fat free" foods. These foods sometimes have the same amount of calories or more calories than the full fat versions. They also often have added sugar, starch, or salt, to make up for flavor that was removed with the fat.  Find a way of tracking calories that works for you. Get creative. Try different apps or programs if writing down calories does not work for you. What are some portion control tips?  Know how many calories are in a serving. This will help you know how many servings of a certain food you can have.  Use a measuring cup to measure serving sizes. You could also try weighing out portions on a kitchen scale. With time, you will be able to estimate serving sizes for some foods.  Take some time to put servings of different foods on your favorite plates, bowls, and cups so you know what a serving looks like.  Try not to eat straight from a bag or box. Doing this can lead to overeating. Put the amount you would like to eat in a cup or on a plate to make sure you are eating the right portion.  Use smaller plates, glasses, and bowls to prevent overeating.  Try not to multitask (for example, watch TV or use your computer) while eating. If it is time to eat, sit down at a table and enjoy your food. This will help you to know when you  are full. It will also help you to be aware of what you are eating and how much you are eating. What are tips for following this plan? Reading food labels  Check the calorie count compared to the serving size. The serving size may be smaller than what you are used to eating.  Check the source of the calories. Make sure the food you are eating is high in vitamins and protein and low in saturated and trans fats. Shopping  Read nutrition labels while you shop. This will help you make healthy decisions before you decide  to purchase your food.  Make a grocery list and stick to it. Cooking  Try to cook your favorite foods in a healthier way. For example, try baking instead of frying.  Use low-fat dairy products. Meal planning  Use more fruits and vegetables. Half of your plate should be fruits and vegetables.  Include lean proteins like poultry and fish. How do I count calories when eating out?  Ask for smaller portion sizes.  Consider sharing an entree and sides instead of getting your own entree.  If you get your own entree, eat only half. Ask for a box at the beginning of your meal and put the rest of your entree in it so you are not tempted to eat it.  If calories are listed on the menu, choose the lower calorie options.  Choose dishes that include vegetables, fruits, whole grains, low-fat dairy products, and lean protein.  Choose items that are boiled, broiled, grilled, or steamed. Stay away from items that are buttered, battered, fried, or served with cream sauce. Items labeled "crispy" are usually fried, unless stated otherwise.  Choose water, low-fat milk, unsweetened iced tea, or other drinks without added sugar. If you want an alcoholic beverage, choose a lower calorie option such as a glass of wine or light beer.  Ask for dressings, sauces, and syrups on the side. These are usually high in calories, so you should limit the amount you eat.  If you want a salad, choose a  garden salad and ask for grilled meats. Avoid extra toppings like bacon, cheese, or fried items. Ask for the dressing on the side, or ask for olive oil and vinegar or lemon to use as dressing.  Estimate how many servings of a food you are given. For example, a serving of cooked rice is  cup or about the size of half a baseball. Knowing serving sizes will help you be aware of how much food you are eating at restaurants. The list below tells you how big or small some common portion sizes are based on everyday objects: ? 1 oz--4 stacked dice. ? 3 oz--1 deck of cards. ? 1 tsp--1 die. ? 1 Tbsp-- a ping-pong ball. ? 2 Tbsp--1 ping-pong ball. ?  cup-- baseball. ? 1 cup--1 baseball. Summary  Calorie counting means keeping track of how many calories you eat and drink each day. If you eat fewer calories than your body needs, you should lose weight.  A healthy amount of weight to lose per week is usually 1-2 lb (0.5-0.9 kg). This usually means reducing your daily calorie intake by 500-750 calories.  The number of calories in a food can be found on a Nutrition Facts label. If a food does not have a Nutrition Facts label, try to look up the calories online or ask your dietitian for help.  Use your calories on foods and drinks that will fill you up, and not on foods and drinks that will leave you hungry.  Use smaller plates, glasses, and bowls to prevent overeating. This information is not intended to replace advice given to you by your health care provider. Make sure you discuss any questions you have with your health care provider. Document Released: 10/30/2005 Document Revised: 07/19/2018 Document Reviewed: 09/29/2016 Elsevier Patient Education  2020 [employer].

## 2019-07-07 NOTE — Progress Notes (Signed)
Name: Alexis Moody   MRN: [id number]    DOB: [birth year]   Date:07/07/2019       Progress Note  Subjective  Chief Complaint  Chief Complaint  Patient presents with  . Follow-up    HPI  Hypertension Patient is on HCTZ 8m daily.  Takes medications as prescribed with no missed doses a month.  She is relatively compliant with low-salt diet.  Denies chest pain, headaches, blurry vision. BP Readings from Last 3 Encounters:  07/07/19 122/86  09/02/18 132/82  02/22/18 128/78    Hyperlipidemia Not on medications currently  Lab Results  Component Value Date   CHOL 197 03/01/2018   HDL 43 03/01/2018   LDLCALC 140 (H) 03/01/2018   TRIG 70 03/01/2018   CHOLHDL 4.6 (H) 03/01/2018  The 10-year ASCVD risk score (Mikey BussingDC Jr., et al., 2013) is: 2.2%   Values used to calculate the score:     Age: [age]years     Sex: Female     Is Non-Hispanic African American: Yes     Diabetic: No     Tobacco smoker: No     Systolic Blood Pressure: 1280mmHg     Is BP treated: Yes     HDL Cholesterol: 43 mg/dL     Total Cholesterol: 197 mg/dL  Morbid obesity & Prediabetes 24 hour Diet recall: Breakfast: cinnamon crunch bagel and cream cheese, apple and water 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 Had 3 bottles of water yesterday.   Exercise: walking 15 minutes 7 days a week.  Wt Readings from Last 3 Encounters:  03/03/19 206 lb (93.4 kg)  09/02/18 217 lb 14.4 oz (98.8 kg)  02/22/18 217 lb 6.4 oz (98.6 kg)   Lab Results  Component Value Date   HGBA1C 6.1 (H) 03/01/2018     [facility] 07/07/2019 09/02/2018 02/22/2018 07/02/2017 05/24/2017  Decreased Interest 0 0 0 0 0  Down, Depressed, Hopeless 0 0 0 0 0  PHQ - 2 Score 0 0 0 0 0  Altered sleeping 0 0 - - -  Tired, decreased energy 0 0 - - -  Change in appetite 0 0 - - -  Feeling bad or failure about yourself  0 0 - - -   Trouble concentrating 0 0 - - -  Moving slowly or fidgety/restless 0 0 - - -  Suicidal thoughts 0 0 - - -  PHQ-9 Score 0 0 - - -  Difficult doing work/chores Not difficult at all Not difficult at all - - -   PHQ reviewed. Negative  Patient Active Problem List   Diagnosis Date Noted  . Medication monitoring encounter 05/24/2017  . Microcytosis 05/17/2016  . Hypertension goal BP (blood pressure) < 140/90 08/16/2015  . HLD (hyperlipidemia) 08/16/2015  . Morbid obesity ([place] 08/16/2015  . Pre-diabetes 08/16/2015    Past Medical History:  Diagnosis Date  . Hyperlipidemia   . Hypertension   . Obesity   . Obesity, Class II, BMI 35-39.9, with comorbidity     Past Surgical History:  Procedure Laterality Date  . REDUCTION MAMMAPLASTY      Social History   Tobacco Use  . Smoking status: Former Smoker    Start date: 11/13/1994    Quit date: 11/14/1999    Years since quitting: 19.6  . Smokeless tobacco: Never Used  Substance Use Topics  . Alcohol use: Yes  Alcohol/week: 0.0 standard drinks    Comment: occasional     Current Outpatient Medications:  .  Cholecalciferol (VITAMIN D) 2000 UNITS CAPS, Take by mouth daily. , Disp: , Rfl:  .  ferrous sulfate 325 (65 FE) MG tablet, Take 325 mg by mouth 3 (three) times a week., Disp: , Rfl:  .  hydrochlorothiazide (HYDRODIURIL) 25 MG tablet, TAKE 1 TABLET BY MOUTH EVERY DAY, Disp: 90 tablet, Rfl: 0  No Known Allergies  ROS   No other specific complaints in a complete review of systems (except as listed in HPI above).  Objective  Vitals:   07/07/19 0858  BP: 122/86  Pulse: 96  Resp: 14  Temp: (!) 96.8 F (36 C)  SpO2: 97%  Height: 5' 1"  (1.549 m)    Body mass index is 38.92 kg/m.  Nursing Note and Vital Signs reviewed.  Physical Exam   Constitutional: Patient appears well-developed and well-nourished. Obese. No distress.  HEENT: head atraumatic, normocephalic, conjunctive clear Cardiovascular: Normal rate,  No  BLE edema. Pulmonary/Chest: Effort normall. No respiratory distress. Skin: no concerning rashes or bruising  Psychiatric: Patient has a normal mood and affect. behavior is normal. Judgment and thought content normal.   Hands off exam due to pandemic and no acute concerns.     No results found for this or any previous visit (from the past 48 hour(s)).  Assessment & Plan  1. Hypertension goal BP (blood pressure) < 140/90 - CMP14+EGFR  2. Mixed hyperlipidemia - Lipid Profile  3. Pre-diabetes - HgB A1c - CMP14+EGFR  4. Morbid obesity ([place]) - Make appointment with dietician through employer - Walking for 45 minutes 3 days a week, start some light weight lifting- 2 days a week.  - My fitness pal- documenting water and calorie intake  - Increase water to 64 ounces daily ( 4 16 ounce water bottles)  Follow up in 6 weeks to see how weight loss is progressing, if not positive progress will refer to weight management.   5. Microcytosis - CBC - Fe+TIBC+Fer  Face-to-face time with patient was more than 25 minutes, >50% time spent counseling and coordination of care

## 2019-07-08 ENCOUNTER — Other Ambulatory Visit: Payer: Self-pay | Admitting: Obstetrics & Gynecology

## 2019-07-08 DIAGNOSIS — Z1231 Encounter for screening mammogram for malignant neoplasm of breast: Secondary | ICD-10-CM

## 2019-07-08 LAB — CBC
Hematocrit: 37.2 % (ref 34.0–46.6)
Hemoglobin: 12.3 g/dL (ref 11.1–15.9)
MCH: 25.9 pg — ABNORMAL LOW (ref 26.6–33.0)
MCHC: 33.1 g/dL (ref 31.5–35.7)
MCV: 79 fL (ref 79–97)
Platelets: 213 10*3/uL (ref 150–450)
RBC: 4.74 x10E6/uL (ref 3.77–5.28)
RDW: 12.9 % (ref 11.7–15.4)
WBC: 6.6 10*3/uL (ref 3.4–10.8)

## 2019-07-08 LAB — IRON,TIBC AND FERRITIN PANEL
Ferritin: 82 ng/mL (ref 15–150)
Iron Saturation: 23 % (ref 15–55)
Iron: 83 ug/dL (ref 27–159)
Total Iron Binding Capacity: 365 ug/dL (ref 250–450)
UIBC: 282 ug/dL (ref 131–425)

## 2019-07-08 LAB — CMP14+EGFR
ALT: 16 IU/L (ref 0–32)
AST: 13 IU/L (ref 0–40)
Albumin/Globulin Ratio: 1.5 (ref 1.2–2.2)
Albumin: 4 g/dL (ref 3.8–4.8)
Alkaline Phosphatase: 71 IU/L (ref 39–117)
BUN/Creatinine Ratio: 17 (ref 9–23)
BUN: 13 mg/dL (ref 6–24)
Bilirubin Total: 0.6 mg/dL (ref 0.0–1.2)
CO2: 25 mmol/L (ref 20–29)
Calcium: 9.6 mg/dL (ref 8.7–10.2)
Chloride: 99 mmol/L (ref 96–106)
Creatinine, Ser: 0.77 mg/dL (ref 0.57–1.00)
GFR calc Af Amer: 111 mL/min/{1.73_m2} (ref >59)
GFR calc non Af Amer: 96 mL/min/{1.73_m2} (ref >59)
Globulin, Total: 2.6 g/dL (ref 1.5–4.5)
Glucose: 91 mg/dL (ref 65–99)
Potassium: 4 mmol/L (ref 3.5–5.2)
Sodium: 140 mmol/L (ref 134–144)
Total Protein: 6.6 g/dL (ref 6.0–8.5)

## 2019-07-08 LAB — LIPID PANEL
Chol/HDL Ratio: 4.5 ratio — ABNORMAL HIGH (ref 0.0–4.4)
Cholesterol, Total: 186 mg/dL (ref 100–199)
HDL: 41 mg/dL (ref >39)
LDL Calculated: 125 mg/dL — ABNORMAL HIGH (ref 0–99)
Triglycerides: 98 mg/dL (ref 0–149)
VLDL Cholesterol Cal: 20 mg/dL (ref 5–40)

## 2019-07-08 LAB — HEMOGLOBIN A1C
Est. average glucose Bld gHb Est-mCnc: 128 mg/dL
Hgb A1c MFr Bld: 6.1 % — ABNORMAL HIGH (ref 4.8–5.6)

## 2019-07-14 ENCOUNTER — Ambulatory Visit
Admission: RE | Admit: 2019-07-14 | Discharge: 2019-07-14 | Disposition: A | Discharge status: Home / Self Care | Payer: Managed Care, Other (non HMO) | Source: Ambulatory Visit | Attending: Obstetrics & Gynecology | Admitting: Obstetrics & Gynecology

## 2019-07-14 DIAGNOSIS — Z1231 Encounter for screening mammogram for malignant neoplasm of breast: Secondary | ICD-10-CM | POA: Insufficient documentation

## 2019-07-29 ENCOUNTER — Other Ambulatory Visit: Payer: Self-pay

## 2019-07-29 DIAGNOSIS — I10 Essential (primary) hypertension: Secondary | ICD-10-CM

## 2019-07-30 MED ORDER — HYDROCHLOROTHIAZIDE 25 MG PO TABS: 25.0000 mg | ORAL_TABLET | Freq: Every day | ORAL | 1 refills | Status: DC

## 2019-08-18 ENCOUNTER — Ambulatory Visit: Payer: Managed Care, Other (non HMO) | Admitting: Family Medicine

## 2020-01-24 ENCOUNTER — Other Ambulatory Visit: Payer: Self-pay | Admitting: Family Medicine

## 2020-01-24 DIAGNOSIS — I10 Essential (primary) hypertension: Secondary | ICD-10-CM

## 2020-02-23 ENCOUNTER — Other Ambulatory Visit: Payer: Self-pay

## 2020-02-23 ENCOUNTER — Encounter: Payer: Self-pay | Admitting: Family Medicine

## 2020-02-23 ENCOUNTER — Ambulatory Visit: Payer: Managed Care, Other (non HMO) | Admitting: Family Medicine

## 2020-02-23 VITALS — BP 130/74 | HR 82 | Temp 98.1°F | Resp 14 | Ht 61.0 in | Wt 196.3 lb

## 2020-02-23 DIAGNOSIS — E782 Mixed hyperlipidemia: Secondary | ICD-10-CM | POA: Diagnosis not present

## 2020-02-23 DIAGNOSIS — E669 Obesity, unspecified: Secondary | ICD-10-CM | POA: Diagnosis not present

## 2020-02-23 DIAGNOSIS — R7303 Prediabetes: Secondary | ICD-10-CM | POA: Diagnosis not present

## 2020-02-23 DIAGNOSIS — E611 Iron deficiency: Secondary | ICD-10-CM

## 2020-02-23 DIAGNOSIS — I10 Essential (primary) hypertension: Secondary | ICD-10-CM | POA: Diagnosis not present

## 2020-02-23 DIAGNOSIS — Z5181 Encounter for therapeutic drug level monitoring: Secondary | ICD-10-CM

## 2020-02-23 DIAGNOSIS — R718 Other abnormality of red blood cells: Secondary | ICD-10-CM

## 2020-02-23 DIAGNOSIS — Z6837 Body mass index (BMI) 37.0-37.9, adult: Secondary | ICD-10-CM

## 2020-02-23 DIAGNOSIS — R600 Localized edema: Secondary | ICD-10-CM

## 2020-02-23 MED ORDER — HYDROCHLOROTHIAZIDE 25 MG PO TABS: 25.0000 mg | ORAL_TABLET | Freq: Every day | ORAL | 3 refills | Status: DC

## 2020-02-23 NOTE — Progress Notes (Signed)
Patient ID: Alexis Moody, female    DOB: [birth year], 44 y.o.   MRN: [id number]  PCP: Ward, Honor Loh, MD  Chief Complaint  Patient presents with  . Follow-up  . Hypertension    Subjective:   Alexis Moody is a 44 y.o. female, presents to clinic with CC of the following:  Pt last seen here 06/2019 by FNP, who has since left the practice, she was seen for HTN, HLD, obesity, she is here for f/up  Hypertension:  Currently managed on HCTZ 25 mg daily Pt reports good med compliance and denies any SE.  No lightheadedness, hypotension, syncope. Blood pressure today is not at goal of <130/80 - will recheck. BP Readings from Last 3 Encounters:  02/23/20 136/78  07/07/19 122/86  09/02/18 132/82  Pt denies CP, SOB, exertional sx, LE edema, palpitation, Ha's, visual disturbances Dietary efforts for BP? Juicing and decreasing portions, loosing weight, doesn't use a lot of salt, but not on low salt diet Rechecking BP before pt leaves   Hyperlipidemia: Current Medication Regimen:  Not on any meds Discussed and reviewed past labs, ASCVD risk, diet and lifestyle changes to improve, and family hx Last Lipids: Lab Results  Component Value Date   CHOL 186 07/07/2019   HDL 41 07/07/2019   LDLCALC 125 (H) 07/07/2019   TRIG 98 07/07/2019   CHOLHDL 4.5 (H) 07/07/2019  The 10-year ASCVD risk score Mikey Bussing DC Jr., et al., 2013) is: 4.3%   Values used to calculate the score:     Age: 89 years     Sex: Female     Is Non-Hispanic African American: Yes     Diabetic: No     Tobacco smoker: No     Systolic Blood Pressure: 578 mmHg     Is BP treated: Yes     HDL Cholesterol: 41 mg/dL     Total Cholesterol: 186 mg/dL - Current Diet:  Avoiding grease, loosing weight - Denies: Chest pain, shortness of breath, myalgias. - Documented aortic atherosclerosis? No - Risk factors for atherosclerosis: hypercholesterolemia and hypertension  Hx of prediabetes and obesity Prediabetes for the past couple  years, father with hx of DM Lab Results  Component Value Date   HGBA1C 6.1 (H) 07/07/2019  No polyuria, polydipsia, nocturia  Obesity, BMI 37, she has lost weight since her last OV here. Down 16 lbs and BMI was >40, now 37 Wt Readings from Last 5 Encounters:  02/23/20 196 lb 4.8 oz (89 kg)  07/07/19 212 lb 11.2 oz (96.5 kg)  03/03/19 206 lb (93.4 kg)  09/02/18 217 lb 14.4 oz (98.8 kg)  02/22/18 217 lb 6.4 oz (98.6 kg)   BMI Readings from Last 5 Encounters:  02/23/20 37.09 kg/m  07/07/19 40.19 kg/m  03/03/19 40.23 kg/m  09/02/18 42.56 kg/m  02/22/18 42.46 kg/m   Diet/exercise/lifestyle changes:  Juicing for one meal a day, decreasing portions, not yet exercising  Anemia?  Iron deficiency? - taking 325 mg iron po supplement three times a week - has been taking for several years (since 2017 at least) Lab Results  Component Value Date   HGB 12.3 07/07/2019  Appears that abnormal labs for the past 4 years has included only microcytosis, no anemia, last couple iron panels have been normal - iron TIBC ferritin iron saturation all normal when checked 06/2019     Patient Active Problem List   Diagnosis Date Noted  . Medication monitoring encounter 05/24/2017  . Microcytosis 05/17/2016  . Hypertension goal  BP (blood pressure) < 140/90 08/16/2015  . HLD (hyperlipidemia) 08/16/2015  . Morbid obesity ([place]) 08/16/2015  . Pre-diabetes 08/16/2015      Current Outpatient Medications:  .  Cholecalciferol (VITAMIN D) 2000 UNITS CAPS, Take by mouth daily. , Disp: , Rfl:  .  ferrous sulfate 325 (65 FE) MG tablet, Take 325 mg by mouth 3 (three) times a week., Disp: , Rfl:  .  hydrochlorothiazide (HYDRODIURIL) 25 MG tablet, TAKE 1 TABLET BY MOUTH EVERY DAY, Disp: 30 tablet, Rfl: 0   No Known Allergies   Family History  Problem Relation Age of Onset  . Hyperlipidemia Mother   . Hypertension Mother   . Breast cancer Mother 76  . Hyperlipidemia Father   . Hypertension Father   .  Diabetes Father   . Hypertension Maternal Grandfather   . Multiple sclerosis Maternal Grandmother   . Leukemia Cousin   . Stroke Neg Hx   . Heart disease Neg Hx      Social History   Socioeconomic History  . Marital status: Married    Spouse name: Not on file  . Number of children: Not on file  . Years of education: Not on file  . Highest education level: Not on file  Occupational History  . Not on file  Tobacco Use  . Smoking status: Former Smoker    Start date: 11/13/1994    Quit date: 11/14/1999    Years since quitting: 20.2  . Smokeless tobacco: Never Used  Substance and Sexual Activity  . Alcohol use: Yes    Alcohol/week: 0.0 standard drinks    Comment: occasional  . Drug use: No  . Sexual activity: Yes    Partners: Male    Birth control/protection: None  Other Topics Concern  . Not on file  Social History Narrative  . Not on file   Social Determinants of Health   Financial Resource Strain:   . Difficulty of Paying Living Expenses:   Food Insecurity:   . Worried About Charity fundraiser in the Last Year:   . Arboriculturist in the Last Year:   Transportation Needs:   . Film/video editor (Medical):   Marland Kitchen Lack of Transportation (Non-Medical):   Physical Activity:   . Days of Exercise per Week:   . Minutes of Exercise per Session:   Stress:   . Feeling of Stress :   Social Connections:   . Frequency of Communication with Friends and Family:   . Frequency of Social Gatherings with Friends and Family:   . Attends Religious Services:   . Active Member of Clubs or Organizations:   . Attends Archivist Meetings:   Marland Kitchen Marital Status:   Intimate Partner Violence:   . Fear of Current or Ex-Partner:   . Emotionally Abused:   Marland Kitchen Physically Abused:   . Sexually Abused:     Chart Review Today: I personally reviewed active problem list, medication list, allergies, family history, social history, health maintenance, notes from last encounter, lab  results, imaging with the patient/caregiver today.   Review of Systems 10 Systems reviewed and are negative for acute change except as noted in the HPI.     Objective:   Vitals:   02/23/20 1036  BP: 136/78  Pulse: 82  Resp: 14  Temp: 98.1 F (36.7 C)  SpO2: 98%  Weight: 196 lb 4.8 oz (89 kg)  Height: 5' 1"  (1.549 m)    Body mass index is 37.09 kg/m.  Physical Exam Vitals and nursing note reviewed.  Constitutional:      General: She is not in acute distress.    Appearance: Normal appearance. She is well-developed. She is not ill-appearing, toxic-appearing or diaphoretic.     Interventions: Face mask in place.  HENT:     Head: Normocephalic and atraumatic.     Right Ear: External ear normal.     Left Ear: External ear normal.  Eyes:     General: Lids are normal. No scleral icterus.       Right eye: No discharge.        Left eye: No discharge.     Conjunctiva/sclera: Conjunctivae normal.  Neck:     Trachea: Phonation normal. No tracheal deviation.  Cardiovascular:     Rate and Rhythm: Normal rate and regular rhythm.     Pulses: Normal pulses.          Radial pulses are 2+ on the right side and 2+ on the left side.       Posterior tibial pulses are 2+ on the right side and 2+ on the left side.     Heart sounds: Normal heart sounds. No murmur. No friction rub. No gallop.   Pulmonary:     Effort: Pulmonary effort is normal. No respiratory distress.     Breath sounds: Normal breath sounds. No stridor. No wheezing, rhonchi or rales.  Chest:     Chest wall: No tenderness.  Abdominal:     General: Bowel sounds are normal. There is no distension.     Palpations: Abdomen is soft.     Tenderness: There is no abdominal tenderness. There is no guarding or rebound.  Musculoskeletal:        General: No deformity. Normal range of motion.     Cervical back: Normal range of motion and neck supple.     Right lower leg: Edema present.     Left lower leg: Edema present.    Lymphadenopathy:     Cervical: No cervical adenopathy.  Skin:    General: Skin is warm and dry.     Capillary Refill: Capillary refill takes less than 2 seconds.     Coloration: Skin is not jaundiced or pale.     Findings: No rash.  Neurological:     Mental Status: She is alert and oriented to person, place, and time.     Motor: No abnormal muscle tone.     Gait: Gait normal.  Psychiatric:        Speech: Speech normal.        Behavior: Behavior normal.      Results for orders placed or performed in visit on 07/07/19  HgB A1c  Result Value Ref Range   Hgb A1c MFr Bld 6.1 (H) 4.8 - 5.6 %   Est. average glucose Bld gHb Est-mCnc 128 mg/dL  Lipid Profile  Result Value Ref Range   Cholesterol, Total 186 100 - 199 mg/dL   Triglycerides 98 0 - 149 mg/dL   HDL 41 >39 mg/dL   VLDL Cholesterol Cal 20 5 - 40 mg/dL   LDL Calculated 125 (H) 0 - 99 mg/dL   Chol/HDL Ratio 4.5 (H) 0.0 - 4.4 ratio  CBC  Result Value Ref Range   WBC 6.6 3.4 - 10.8 x10E3/uL   RBC 4.74 3.77 - 5.28 x10E6/uL   Hemoglobin 12.3 11.1 - 15.9 g/dL   Hematocrit 37.2 34.0 - 46.6 %   MCV 79 79 - 97 fL  MCH 25.9 (L) 26.6 - 33.0 pg   MCHC 33.1 31.5 - 35.7 g/dL   RDW 12.9 11.7 - 15.4 %   Platelets 213 150 - 450 x10E3/uL  Fe+TIBC+Fer  Result Value Ref Range   Total Iron Binding Capacity 365 250 - 450 ug/dL   UIBC 282 131 - 425 ug/dL   Iron 83 27 - 159 ug/dL   Iron Saturation 23 15 - 55 %   Ferritin 82 15 - 150 ng/mL  CMP14+EGFR  Result Value Ref Range   Glucose 91 65 - 99 mg/dL   BUN 13 6 - 24 mg/dL   Creatinine, Ser 0.77 0.57 - 1.00 mg/dL   GFR calc non Af Amer 96 >59 mL/min/1.73   GFR calc Af Amer 111 >59 mL/min/1.73   BUN/Creatinine Ratio 17 9 - 23   Sodium 140 134 - 144 mmol/L   Potassium 4.0 3.5 - 5.2 mmol/L   Chloride 99 96 - 106 mmol/L   CO2 25 20 - 29 mmol/L   Calcium 9.6 8.7 - 10.2 mg/dL   Total Protein 6.6 6.0 - 8.5 g/dL   Albumin 4.0 3.8 - 4.8 g/dL   Globulin, Total 2.6 1.5 - 4.5 g/dL    Albumin/Globulin Ratio 1.5 1.2 - 2.2   Bilirubin Total 0.6 0.0 - 1.2 mg/dL   Alkaline Phosphatase 71 39 - 117 IU/L   AST 13 0 - 40 IU/L   ALT 16 0 - 32 IU/L        Assessment & Plan:      ICD-10-CM   1. Essential hypertension  I10 Comprehensive metabolic panel    hydrochlorothiazide (HYDRODIURIL) 25 MG tablet    CANCELED: COMPLETE METABOLIC PANEL WITH GFR   borderline of goal, con't HCTZ, checking labs, add exercise for heart health, low salt diet, continue other lifestyle/diet efforts  2. Mixed hyperlipidemia  E78.2 Lipid panel    Comprehensive metabolic panel    CANCELED: COMPLETE METABOLIC PANEL WITH GFR    CANCELED: Lipid panel   pt has lost weight is working on diet, avoiding fat/grease, recheck labs  3. Pre-diabetes  R73.03 Hemoglobin A1c    Comprehensive metabolic panel    CANCELED: COMPLETE METABOLIC PANEL WITH GFR    CANCELED: Hemoglobin A1c   recheck labs  4. Class 2 obesity with body mass index (BMI) of 37.0 to 37.9 in adult, unspecified obesity type, unspecified whether serious comorbidity present  E66.9 Hemoglobin A1c   Z68.37 Lipid panel    Comprehensive metabolic panel    CANCELED: COMPLETE METABOLIC PANEL WITH GFR    CANCELED: Lipid panel    CANCELED: Hemoglobin A1c   16 lbs weight loss since last OV here, commended on efforts, encouraged to keep up the good work w diet, add exercise  5. Iron deficiency  E61.1 Iron, TIBC and Ferritin Panel    CBC with Differential/Platelet   supplementing iron po 3x a week, last for the past 4 years normal (CBC and iron panel) microcytosis persists, past neg electrophoresis by Dr. Sanda Klein  6. Microcytosis  R71.8 Iron, TIBC and Ferritin Panel    CBC with Differential/Platelet   recheck labs per pt, no sx of anemia, tolerating po iron supplement that she has been doing a long time.   7. Bilateral lower extremity edema  R60.0 Comprehensive metabolic panel    hydrochlorothiazide (HYDRODIURIL) 25 MG tablet   non-pitting to ankles,  likely due to stasis? venous insuffiency?  sits at desk a lot, tx with low salt, compression socks, elevate  legs, walk/exercise  8. Medication monitoring encounter  Z51.81 Iron, TIBC and Ferritin Panel    CBC with Differential/Platelet     f/up 6 months    Delsa Grana, PA-C 02/23/20 10:43 AM

## 2020-02-24 LAB — CBC WITH DIFFERENTIAL/PLATELET
Basophils Absolute: 0 10*3/uL (ref 0.0–0.2)
Basos: 0 %
EOS (ABSOLUTE): 0.2 10*3/uL (ref 0.0–0.4)
Eos: 3 %
Hematocrit: 35.8 % (ref 34.0–46.6)
Hemoglobin: 12 g/dL (ref 11.1–15.9)
Immature Grans (Abs): 0 10*3/uL (ref 0.0–0.1)
Immature Granulocytes: 0 %
Lymphocytes Absolute: 2.4 10*3/uL (ref 0.7–3.1)
Lymphs: 33 %
MCH: 25.6 pg — ABNORMAL LOW (ref 26.6–33.0)
MCHC: 33.5 g/dL (ref 31.5–35.7)
MCV: 76 fL — ABNORMAL LOW (ref 79–97)
Monocytes Absolute: 1.1 10*3/uL — ABNORMAL HIGH (ref 0.1–0.9)
Monocytes: 15 %
Neutrophils Absolute: 3.6 10*3/uL (ref 1.4–7.0)
Neutrophils: 49 %
Platelets: 191 10*3/uL (ref 150–450)
RBC: 4.69 x10E6/uL (ref 3.77–5.28)
RDW: 14.4 % (ref 11.7–15.4)
WBC: 7.4 10*3/uL (ref 3.4–10.8)

## 2020-02-24 LAB — COMPREHENSIVE METABOLIC PANEL
ALT: 16 IU/L (ref 0–32)
AST: 17 IU/L (ref 0–40)
Albumin/Globulin Ratio: 1.7 (ref 1.2–2.2)
Albumin: 3.9 g/dL (ref 3.8–4.8)
Alkaline Phosphatase: 87 IU/L (ref 39–117)
BUN/Creatinine Ratio: 22 (ref 9–23)
BUN: 12 mg/dL (ref 6–24)
Bilirubin Total: 0.5 mg/dL (ref 0.0–1.2)
CO2: 23 mmol/L (ref 20–29)
Calcium: 9.8 mg/dL (ref 8.7–10.2)
Chloride: 101 mmol/L (ref 96–106)
Creatinine, Ser: 0.55 mg/dL — ABNORMAL LOW (ref 0.57–1.00)
GFR calc Af Amer: 134 mL/min/{1.73_m2} (ref >59)
GFR calc non Af Amer: 116 mL/min/{1.73_m2} (ref >59)
Globulin, Total: 2.3 g/dL (ref 1.5–4.5)
Glucose: 87 mg/dL (ref 65–99)
Potassium: 3.6 mmol/L (ref 3.5–5.2)
Sodium: 140 mmol/L (ref 134–144)
Total Protein: 6.2 g/dL (ref 6.0–8.5)

## 2020-02-24 LAB — IRON,TIBC AND FERRITIN PANEL
Ferritin: 123 ng/mL (ref 15–150)
Iron Saturation: 20 % (ref 15–55)
Iron: 65 ug/dL (ref 27–159)
Total Iron Binding Capacity: 325 ug/dL (ref 250–450)
UIBC: 260 ug/dL (ref 131–425)

## 2020-02-24 LAB — HEMOGLOBIN A1C
Est. average glucose Bld gHb Est-mCnc: 117 mg/dL
Hgb A1c MFr Bld: 5.7 % — ABNORMAL HIGH (ref 4.8–5.6)

## 2020-02-24 LAB — LIPID PANEL
Chol/HDL Ratio: 3.5 ratio (ref 0.0–4.4)
Cholesterol, Total: 149 mg/dL (ref 100–199)
HDL: 43 mg/dL (ref >39)
LDL Chol Calc (NIH): 90 mg/dL (ref 0–99)
Triglycerides: 81 mg/dL (ref 0–149)
VLDL Cholesterol Cal: 16 mg/dL (ref 5–40)

## 2020-02-26 ENCOUNTER — Other Ambulatory Visit: Payer: Self-pay | Admitting: Family Medicine

## 2020-03-22 ENCOUNTER — Encounter: Payer: Self-pay | Admitting: Family Medicine

## 2020-04-21 ENCOUNTER — Other Ambulatory Visit: Payer: Self-pay

## 2020-04-21 ENCOUNTER — Ambulatory Visit: Payer: Managed Care, Other (non HMO) | Admitting: Dermatology

## 2020-04-21 DIAGNOSIS — L918 Other hypertrophic disorders of the skin: Secondary | ICD-10-CM

## 2020-04-21 NOTE — Progress Notes (Signed)
   New Patient Visit  Subjective  Alexis Moody is a 44 y.o. female who presents for the following: Skin Problem.  Patient here today to have some spots removed at neck. Spots have been there for quite some time and are irritating. No history of any skin cancer.   The following portions of the chart were reviewed this encounter and updated as appropriate:      Review of Systems:  No other skin or systemic complaints except as noted in HPI or Assessment and Plan.  Objective  Well appearing patient in no apparent distress; mood and affect are within normal limits.  A focused examination was performed including face, neck. Relevant physical exam findings are noted in the Assessment and Plan.  Objective  Neck - Anterior: Fleshy, skin-colored pedunculated papules.     Assessment & Plan   Acrochordons (Skin Tags) - Removal desired by patient - Hyperpigmented pedunculated papules, irritated for patient. - Benign appearing.  - Patient desires removal. Reviewed that this may not be covered by insurance. Patient signed non-covered consent.  - Prior to the procedure, reviewed the expected small wound. Also reviewed the risk of leaving a small scar and the small risk of infection.  PROCEDURE - The areas were prepped with isopropyl alcohol. A small amount of lidocaine 1% with epinephrine was injected at the base of each lesion to achieve good local anesthesia. The skin tags were removed using a snip technique. Electrodessication was used for hemostasis. Petrolatum was applied. The procedure was tolerated well. - Wound care was reviewed with the patient. They were advised to call with any concerns. Total number of treated acrochordons 15  Return if symptoms worsen or fail to improve.  Anise Salvo, RMA, am acting as scribe for Willeen Niece, MD .  Documentation: I have reviewed the above documentation for accuracy and completeness, and I agree with the above.  Willeen Niece MD

## 2020-08-30 ENCOUNTER — Ambulatory Visit: Payer: Managed Care, Other (non HMO) | Admitting: Family Medicine

## 2020-09-20 ENCOUNTER — Encounter: Payer: Self-pay | Admitting: Family Medicine

## 2020-09-20 ENCOUNTER — Ambulatory Visit: Payer: Managed Care, Other (non HMO) | Admitting: Family Medicine

## 2020-09-20 ENCOUNTER — Other Ambulatory Visit: Payer: Self-pay

## 2020-09-20 VITALS — BP 128/84 | HR 98 | Temp 98.1°F | Resp 16 | Ht 61.0 in | Wt 184.8 lb

## 2020-09-20 DIAGNOSIS — Z5181 Encounter for therapeutic drug level monitoring: Secondary | ICD-10-CM

## 2020-09-20 DIAGNOSIS — R718 Other abnormality of red blood cells: Secondary | ICD-10-CM

## 2020-09-20 DIAGNOSIS — I10 Essential (primary) hypertension: Secondary | ICD-10-CM | POA: Diagnosis not present

## 2020-09-20 DIAGNOSIS — E782 Mixed hyperlipidemia: Secondary | ICD-10-CM

## 2020-09-20 DIAGNOSIS — E669 Obesity, unspecified: Secondary | ICD-10-CM

## 2020-09-20 DIAGNOSIS — Z1159 Encounter for screening for other viral diseases: Secondary | ICD-10-CM

## 2020-09-20 DIAGNOSIS — Z1231 Encounter for screening mammogram for malignant neoplasm of breast: Secondary | ICD-10-CM

## 2020-09-20 DIAGNOSIS — R7303 Prediabetes: Secondary | ICD-10-CM | POA: Diagnosis not present

## 2020-09-20 DIAGNOSIS — Z6834 Body mass index (BMI) 34.0-34.9, adult: Secondary | ICD-10-CM

## 2020-09-20 NOTE — Progress Notes (Signed)
Name: Alexis Moody   MRN: [id number]    DOB: Mar 27, 1976   Date:09/20/2020       Progress Note  Chief Complaint  Patient presents with  . Hypertension  . Hyperlipidemia     Subjective:   Alexis Moody is a 44 y.o. female, presents to clinic for routine f/up  Hypertension:  Currently managed on HCTZ 25 mg daily Pt reports good med compliance and denies any SE.   Blood pressure today is still elevated- similar last OV, will repeat   BP Readings from Last 3 Encounters:  09/20/20 136/82  02/23/20 130/74  07/07/19 122/86  Dietary efforts for BP? stopped juicing, portion control , loosing weight Wants to decrease med dose, but usually above goal with OV  Hyperlipidemia:  LDL improved with last OV  Current Medication Regimen:  Not on any meds Discussed and reviewed past labs, ASCVD risk, diet and lifestyle changes to improve, and family hx Lab Results  Component Value Date   CHOL 149 02/23/2020   CHOL 186 07/07/2019   CHOL 197 03/01/2018   Lab Results  Component Value Date   HDL 43 02/23/2020   HDL 41 07/07/2019   HDL 43 03/01/2018   Lab Results  Component Value Date   LDLCALC 90 02/23/2020   LDLCALC 125 (H) 07/07/2019   LDLCALC 140 (H) 03/01/2018   Lab Results  Component Value Date   TRIG 81 02/23/2020   TRIG 98 07/07/2019   TRIG 70 03/01/2018   Lab Results  Component Value Date   CHOLHDL 3.5 02/23/2020   CHOLHDL 4.5 (H) 07/07/2019   CHOLHDL 4.6 (H) 03/01/2018   No results found for: LDLDIRECT The 10-year ASCVD risk score Denman George DC Jr., et al., 2013) is: 3.2%   Values used to calculate the score:     Age: 60 years     Sex: Female     Is Non-Hispanic African American: Yes     Diabetic: No     Tobacco smoker: No     Systolic Blood Pressure: 136 mmHg     Is BP treated: Yes     HDL Cholesterol: 43 mg/dL     Total Cholesterol: 149 mg/dL  - Current Diet:  Avoiding grease, smaller portions, not craving as muchloosing weight denies Chest pain, shortness of  breath, myalgias. - Documented aortic atherosclerosis? No - Risk factors for atherosclerosis: hypercholesterolemia and hypertension  Hx of prediabetes, obesity, family hx of DM Prediabetes for the past couple years, father with hx of DM Denies: Polyuria, polydipsia, vision changes, neuropathy, hypoglycemia Recent pertinent labs: Lab Results  Component Value Date   HGBA1C 5.7 (H) 02/23/2020   HGBA1C 6.1 (H) 07/07/2019   HGBA1C 6.1 (H) 03/01/2018   Obesity: More weight loss since last OV down 12 more lbs Last visit pt was loosing weight -down 16 lbs and BMI was >40, now 37 Wt Readings from Last 5 Encounters:  09/20/20 184 lb 12.8 oz (83.8 kg)  02/23/20 196 lb 4.8 oz (89 kg)  07/07/19 212 lb 11.2 oz (96.5 kg)  03/03/19 206 lb (93.4 kg)  09/02/18 217 lb 14.4 oz (98.8 kg)   BMI Readings from Last 5 Encounters:  09/20/20 34.92 kg/m  02/23/20 37.09 kg/m  07/07/19 40.19 kg/m  03/03/19 40.23 kg/m  09/02/18 42.56 kg/m   Diet/exercise/lifestyle changes:   decreasing portions, going to start working out  Hx of anemia? Microcytosis vs iron deficiency, pt was taking 325 mg iron po supplement three times a week - has been  taking for several years (since 2017 at least) Hemoglobin  Date Value Ref Range Status  02/23/2020 12.0 11.1 - 15.9 g/dL Final  50/35/4656 81.2 11.1 - 15.9 g/dL Final  75/17/0017 49.4 11.1 - 15.9 g/dL Final  49/67/5916 38.4 11.1 - 15.9 g/dL Final   Last CBC Lab Results  Component Value Date   WBC 7.4 02/23/2020   HGB 12.0 02/23/2020   HCT 35.8 02/23/2020   MCV 76 (L) 02/23/2020   MCH 25.6 (L) 02/23/2020   RDW 14.4 02/23/2020   PLT 191 02/23/2020   Lab Results  Component Value Date   IRON 65 02/23/2020   TIBC 325 02/23/2020   FERRITIN 123 02/23/2020  Labs reviewed- past 4 years has included only microcytosis, no anemia, last couple iron panels have been normal - iron TIBC ferritin iron saturation all normal when checked 06/2019  After last labs in  April- advised pt to stop iron supplement - She had decreased iron dose again to only once a week now  Palpitations/rapid HR:  Heart was pounding and racing, felt irregular, she cut out caffeine and it hasn't reoccurred since  Hx of Vit D deficiency - has been supplementing, last labs were normal   Current Outpatient Medications:  .  Cholecalciferol (VITAMIN D) 2000 UNITS CAPS, Take by mouth daily. , Disp: , Rfl:  .  ferrous sulfate 324 MG TBEC, Take 324 mg by mouth., Disp: , Rfl:  .  hydrochlorothiazide (HYDRODIURIL) 25 MG tablet, Take 1 tablet (25 mg total) by mouth daily., Disp: 90 tablet, Rfl: 3  Patient Active Problem List   Diagnosis Date Noted  . Microcytosis 05/17/2016  . Hypertension goal BP (blood pressure) < 140/90 08/16/2015  . HLD (hyperlipidemia) 08/16/2015  . Pre-diabetes 08/16/2015    Past Surgical History:  Procedure Laterality Date  . REDUCTION MAMMAPLASTY      Family History  Problem Relation Age of Onset  . Hyperlipidemia Mother   . Hypertension Mother   . Breast cancer Mother 51  . Hyperlipidemia Father   . Hypertension Father   . Diabetes Father   . Hypertension Maternal Grandfather   . Multiple sclerosis Maternal Grandmother   . Leukemia Cousin   . Stroke Neg Hx   . Heart disease Neg Hx     Social History   Tobacco Use  . Smoking status: Former Smoker    Start date: 11/13/1994    Quit date: 11/14/1999    Years since quitting: 20.8  . Smokeless tobacco: Never Used  Vaping Use  . Vaping Use: Former  Substance Use Topics  . Alcohol use: Yes    Alcohol/week: 0.0 standard drinks    Comment: occasional  . Drug use: No     No Known Allergies  Health Maintenance  Topic Date Due  . Hepatitis C Screening  Never done  . PAP SMEAR-Modifier  06/26/2020  . INFLUENZA VACCINE  02/10/2021 (Originally 06/13/2020)  . HIV Screening  02/22/2021 (Originally 11/25/1992)  . TETANUS/TDAP  07/03/2027  . COVID-19 Vaccine  Completed    Chart Review Today: I  personally reviewed active problem list, medication list, allergies, family history, social history, health maintenance, notes from last encounter, lab results, imaging with the patient/caregiver today.   Review of Systems  10 Systems reviewed and are negative for acute change except as noted in the HPI.  Objective:   Vitals:   09/20/20 0927  BP: 136/82  Pulse: 98  Resp: 16  Temp: 98.1 F (36.7 C)  TempSrc: Oral  SpO2: 95%  Weight: 184 lb 12.8 oz (83.8 kg)  Height:  (1.549 m)    Body mass index is 34.92 kg/m.  Physical Exam Vitals and nursing note reviewed.  Constitutional:      General: She is not in acute distress.    Appearance: Normal appearance. She is well-developed. She is obese. She is not ill-appearing, toxic-appearing or diaphoretic.     Interventions: Face mask in place.  HENT:     Head: Normocephalic and atraumatic.     Right Ear: External ear normal.     Left Ear: External ear normal.  Eyes:     General: Lids are normal. No scleral icterus.       Right eye: No discharge.        Left eye: No discharge.     Conjunctiva/sclera: Conjunctivae normal.  Neck:     Trachea: Phonation normal. No tracheal deviation.  Cardiovascular:     Rate and Rhythm: Normal rate and regular rhythm.  No extrasystoles are present.    Chest Wall: PMI is not displaced. No thrill.     Pulses: Normal pulses.          Radial pulses are 2+ on the right side and 2+ on the left side.     Heart sounds: Normal heart sounds. Heart sounds not distant. No murmur heard.  No friction rub. No gallop.   Pulmonary:     Effort: Pulmonary effort is normal. No respiratory distress.     Breath sounds: Normal breath sounds. No stridor. No wheezing, rhonchi or rales.  Chest:     Chest wall: No tenderness.  Abdominal:     General: Bowel sounds are normal. There is no distension.     Palpations: Abdomen is soft.  Musculoskeletal:     Right lower leg: No edema.     Left lower leg: No edema.    Skin:    General: Skin is warm and dry.     Coloration: Skin is not jaundiced or pale.     Findings: No rash.  Neurological:     Mental Status: She is alert.     Motor: No abnormal muscle tone.     Gait: Gait normal.  Psychiatric:        Mood and Affect: Mood normal.        Speech: Speech normal.        Behavior: Behavior normal.         Assessment & Plan:   1. Hypertension goal BP (blood pressure) <130/80 Suggest changing to losartan-HCTZ 50-12.5 - Comprehensive metabolic panel  2. Pre-diabetes Last 5.7, loosing weight, family hx - Hemoglobin A1c - Comprehensive metabolic panel  3. Mixed hyperlipidemia Last LDL was decreasing with diet/weightloss efforts - reviewed ASCVD risk and last lipid panel, will recheck in 6 months - Comprehensive metabolic panel  4. Microcytosis Normal H/H with low MCV, normal iron panel, she has decreased iron supplement, recheck CBC - may be genetic/hereditary - discussed with pt if she is not anemia, and has never needed transfusion, she is unlikely to ever have problems with microcytosis and would not really need it worked up by specialists - CBC with Differential/Platelet  5. Encounter for hepatitis C screening test for low risk patient - Hepatitis C antibody  6. Encounter for medication monitoring - CBC with Differential/Platelet - Hepatitis C antibody - Hemoglobin A1c - Comprehensive metabolic panel  7. Encounter for screening mammogram for malignant neoplasm of breast Ordered - pt will schedule -  she has well woman coming up with OBGYN - MM 3D SCREEN BREAST BILATERAL; Future  8. Class 1 obesity with serious comorbidity and body mass index (BMI) of 34.0 to 34.9 in adult, unspecified obesity type Pt loosing weight, down 28 lbs in the past year, encouraged continued efforts and monitoring other aspects of health like body measurements as she may see a weight plateau but still may be making progress    Return in about 6 months  (around 03/20/2021) for Routine follow-up.   Danelle Berry, PA-C 09/20/20 9:48 AM

## 2020-09-20 NOTE — Patient Instructions (Signed)
[facility] at [facility] [address] [place],  [place]  [address] Get Driving Directions Main: [phone]   Suggest changing blood pressure medicine to lower HCTZ and add losartan   DASH Eating Plan DASH stands for "Dietary Approaches to Stop Hypertension." The DASH eating plan is a healthy eating plan that has been shown to reduce high blood pressure (hypertension). It may also reduce your risk for type 2 diabetes, heart disease, and stroke. The DASH eating plan may also help with weight loss. What are tips for following this plan?  General guidelines  Avoid eating more than 2,300 mg (milligrams) of salt (sodium) a day. If you have hypertension, you may need to reduce your sodium intake to 1,500 mg a day.  Limit alcohol intake to no more than 1 drink a day for nonpregnant women and 2 drinks a day for men. One drink equals 12 oz of beer, 5 oz of wine, or 1 oz of hard liquor.  Work with your health care provider to maintain a healthy body weight or to lose weight. Ask what an ideal weight is for you.  Get at least 30 minutes of exercise that causes your heart to beat faster (aerobic exercise) most days of the week. Activities may include walking, swimming, or biking.  Work with your health care provider or diet and nutrition specialist (dietitian) to adjust your eating plan to your individual calorie needs. Reading food labels   Check food labels for the amount of sodium per serving. Choose foods with less than 5 percent of the Daily Value of sodium. Generally, foods with less than 300 mg of sodium per serving fit into this eating plan.  To find whole grains, look for the word "whole" as the first word in the ingredient list. Shopping  Buy products labeled as "low-sodium" or "no salt added."  Buy fresh foods. Avoid canned foods and premade or frozen meals. Cooking  Avoid adding salt when cooking. Use salt-free seasonings or herbs instead of table  salt or sea salt. Check with your health care provider or pharmacist before using salt substitutes.  Do not fry foods. Cook foods using healthy methods such as baking, boiling, grilling, and broiling instead.  Cook with heart-healthy oils, such as olive, canola, soybean, or sunflower oil. Meal planning  Eat a balanced diet that includes: ? 5 or more servings of fruits and vegetables each day. At each meal, try to fill half of your plate with fruits and vegetables. ? Up to 6-8 servings of whole grains each day. ? Less than 6 oz of lean meat, poultry, or fish each day. A 3-oz serving of meat is about the same size as a deck of cards. One egg equals 1 oz. ? 2 servings of low-fat dairy each day. ? A serving of nuts, seeds, or beans 5 times each week. ? Heart-healthy fats. Healthy fats called Omega-3 fatty acids are found in foods such as flaxseeds and coldwater fish, like sardines, salmon, and mackerel.  Limit how much you eat of the following: ? Canned or prepackaged foods. ? Food that is high in trans fat, such as fried foods. ? Food that is high in saturated fat, such as fatty meat. ? Sweets, desserts, sugary drinks, and other foods with added sugar. ? Full-fat dairy products.  Do not salt foods before eating.  Try to eat at least 2 vegetarian meals each week.  Eat more home-cooked food and less restaurant, buffet, and fast food.  When  eating at a restaurant, ask that your food be prepared with less salt or no salt, if possible. What foods are recommended? The items listed may not be a complete list. Talk with your dietitian about what dietary choices are best for you. Grains Whole-grain or whole-wheat bread. Whole-grain or whole-wheat pasta. Brown rice. [place]ena Morrow. Bulgur. Whole-grain and low-sodium cereals. Pita bread. Low-fat, low-sodium crackers. Whole-wheat flour tortillas. Vegetables Fresh or frozen vegetables (raw, steamed, roasted, or grilled). Low-sodium or  reduced-sodium tomato and vegetable juice. Low-sodium or reduced-sodium tomato sauce and tomato paste. Low-sodium or reduced-sodium canned vegetables. Fruits All fresh, dried, or frozen fruit. Canned fruit in natural juice (without added sugar). Meat and other protein foods Skinless chicken or [place]. Ground chicken or [place]. Pork with fat trimmed off. Fish and seafood. Egg whites. Dried beans, peas, or lentils. Unsalted nuts, nut butters, and seeds. Unsalted canned beans. Lean cuts of beef with fat trimmed off. Low-sodium, lean deli meat. Dairy Low-fat (1%) or fat-free (skim) milk. Fat-free, low-fat, or reduced-fat cheeses. Nonfat, low-sodium ricotta or cottage cheese. Low-fat or nonfat yogurt. Low-fat, low-sodium cheese. Fats and oils Soft margarine without trans fats. Vegetable oil. Low-fat, reduced-fat, or light mayonnaise and salad dressings (reduced-sodium). Canola, safflower, olive, soybean, and sunflower oils. Avocado. Seasoning and other foods Herbs. Spices. Seasoning mixes without salt. Unsalted popcorn and pretzels. Fat-free sweets. What foods are not recommended? The items listed may not be a complete list. Talk with your dietitian about what dietary choices are best for you. Grains Baked goods made with fat, such as croissants, muffins, or some breads. Dry pasta or rice meal packs. Vegetables Creamed or fried vegetables. Vegetables in a cheese sauce. Regular canned vegetables (not low-sodium or reduced-sodium). Regular canned tomato sauce and paste (not low-sodium or reduced-sodium). Regular tomato and vegetable juice (not low-sodium or reduced-sodium). Angie Fava. Olives. Fruits Canned fruit in a light or heavy syrup. Fried fruit. Fruit in cream or butter sauce. Meat and other protein foods Fatty cuts of meat. Ribs. Fried meat. Berniece Salines. Sausage. Bologna and other processed lunch meats. Salami. Fatback. Hotdogs. Bratwurst. Salted nuts and seeds. Canned beans with added salt. Canned or  smoked fish. Whole eggs or egg yolks. Chicken or [place] with skin. Dairy Whole or 2% milk, cream, and half-and-half. Whole or full-fat cream cheese. Whole-fat or sweetened yogurt. Full-fat cheese. Nondairy creamers. Whipped toppings. Processed cheese and cheese spreads. Fats and oils Butter. Stick margarine. Lard. Shortening. Ghee. Bacon fat. Tropical oils, such as coconut, palm kernel, or palm oil. Seasoning and other foods Salted popcorn and pretzels. Onion salt, garlic salt, seasoned salt, table salt, and sea salt. Worcestershire sauce. Tartar sauce. Barbecue sauce. Teriyaki sauce. Soy sauce, including reduced-sodium. Steak sauce. Canned and packaged gravies. Fish sauce. Oyster sauce. Cocktail sauce. Horseradish that you find on the shelf. Ketchup. Mustard. Meat flavorings and tenderizers. Bouillon cubes. Hot sauce and Tabasco sauce. Premade or packaged marinades. Premade or packaged taco seasonings. Relishes. Regular salad dressings. Where to find more information:  National Heart, Lung, and [place]: https://wilson-eaton.com/  American Heart Association: www.heart.org Summary  The DASH eating plan is a healthy eating plan that has been shown to reduce high blood pressure (hypertension). It may also reduce your risk for type 2 diabetes, heart disease, and stroke.  With the DASH eating plan, you should limit salt (sodium) intake to 2,300 mg a day. If you have hypertension, you may need to reduce your sodium intake to 1,500 mg a day.  When on the DASH eating plan, aim  to eat more fresh fruits and vegetables, whole grains, lean proteins, low-fat dairy, and heart-healthy fats.  Work with your health care provider or diet and nutrition specialist (dietitian) to adjust your eating plan to your individual calorie needs. This information is not intended to replace advice given to you by your health care provider. Make sure you discuss any questions you have with your health care provider. Document  Revised: 10/12/2017 Document Reviewed: 10/23/2016 Elsevier Patient Education  2020 [employer].

## 2020-09-28 LAB — HM PAP SMEAR: HM Pap smear: NEGATIVE

## 2020-12-20 ENCOUNTER — Other Ambulatory Visit: Payer: Self-pay

## 2020-12-20 ENCOUNTER — Ambulatory Visit
Admission: RE | Admit: 2020-12-20 | Discharge: 2020-12-20 | Disposition: A | Discharge status: Home / Self Care | Payer: Managed Care, Other (non HMO) | Source: Ambulatory Visit | Attending: Family Medicine | Admitting: Family Medicine

## 2020-12-20 DIAGNOSIS — Z1231 Encounter for screening mammogram for malignant neoplasm of breast: Secondary | ICD-10-CM | POA: Insufficient documentation

## 2021-02-11 ENCOUNTER — Other Ambulatory Visit: Payer: Self-pay | Admitting: Family Medicine

## 2021-02-11 DIAGNOSIS — I10 Essential (primary) hypertension: Secondary | ICD-10-CM

## 2021-02-11 DIAGNOSIS — R600 Localized edema: Secondary | ICD-10-CM

## 2021-03-21 ENCOUNTER — Ambulatory Visit: Payer: Managed Care, Other (non HMO) | Admitting: Family Medicine

## 2021-12-01 ENCOUNTER — Telehealth: Payer: Self-pay | Admitting: Family Medicine

## 2021-12-01 DIAGNOSIS — I10 Essential (primary) hypertension: Secondary | ICD-10-CM

## 2021-12-01 DIAGNOSIS — R600 Localized edema: Secondary | ICD-10-CM

## 2021-12-01 NOTE — Telephone Encounter (Signed)
Requested medication (s) are due for refill today: yes, needs appt  Requested medication (s) are on the active medication list: yes    Last refill: 02/11/21  #90 3 refills  Future visit scheduled no  Notes to clinic:Pt needs appt, attempted to reach, left VM to call back  Requested Prescriptions  Pending Prescriptions Disp Refills   hydrochlorothiazide (HYDRODIURIL) 25 MG tablet 90 tablet 3     Cardiovascular: Diuretics - Thiazide Failed - 12/01/2021  3:43 PM      Failed - Ca in normal range and within 360 days    Calcium  Date Value Ref Range Status  02/23/2020 9.8 8.7 - 10.2 mg/dL Final          Failed - Cr in normal range and within 360 days    Creatinine, Ser  Date Value Ref Range Status  02/23/2020 0.55 (L) 0.57 - 1.00 mg/dL Final          Failed - K in normal range and within 360 days    Potassium  Date Value Ref Range Status  02/23/2020 3.6 3.5 - 5.2 mmol/L Final          Failed - Na in normal range and within 360 days    Sodium  Date Value Ref Range Status  02/23/2020 140 134 - 144 mmol/L Final          Failed - Valid encounter within last 6 months    Recent Outpatient Visits           1 year ago Hypertension goal BP (blood pressure) < 140/90   [facility] Danelle Berry, PA-C   1 year ago Essential hypertension   [facility] Danelle Berry, PA-C   2 years ago Hypertension goal BP (blood pressure) < 140/90   [facility] Cheryle Horsfall, NP   2 years ago Skin change   [facility] Lada, Janit Bern, MD   3 years ago Morbid obesity [facility])   [facility] Lada, Janit Bern, MD              Passed - Last BP in normal range    BP Readings from Last 1 Encounters:  09/20/20 128/84

## 2021-12-01 NOTE — Telephone Encounter (Signed)
Medication Refill - Medication: hydrochlorothiazide (HYDRODIURIL) 25 MG tablet  Pt is completely out of her current supply, and has contacted the pharmacy for refills for some time.  Has the patient contacted their pharmacy? Yes.   (Agent: If no, request that the patient contact the pharmacy for the refill. If patient does not wish to contact the pharmacy document the reason why and proceed with request.) (Agent: If yes, when and what did the pharmacy advise?)  Preferred Pharmacy (with phone number or street name):  [id number] [place], [place] - [address] AT [facility]  [address] [place] [address]  Phone: [phone] Fax: [phone]   Has the patient been seen for an appointment in the last year OR does the patient have an upcoming appointment? Yes.    Agent: Please be advised that RX refills may take up to 3 business days. We ask that you follow-up with your pharmacy.

## 2021-12-02 ENCOUNTER — Other Ambulatory Visit: Payer: Self-pay | Admitting: Nurse Practitioner

## 2021-12-02 DIAGNOSIS — R600 Localized edema: Secondary | ICD-10-CM

## 2021-12-02 DIAGNOSIS — I10 Essential (primary) hypertension: Secondary | ICD-10-CM

## 2021-12-02 MED ORDER — HYDROCHLOROTHIAZIDE 25 MG PO TABS: ORAL_TABLET | ORAL | 3 refills | Status: DC

## 2021-12-02 NOTE — Telephone Encounter (Signed)
Pt was notified.  

## 2021-12-02 NOTE — Telephone Encounter (Signed)
Pt has scheduled an appt with you for Monday, has not been seen since Nov,2021 would you refill her med?

## 2021-12-02 NOTE — Telephone Encounter (Signed)
Pt scheduled appt with Della Goo for Monday. She ran out of this medication 2 days ago and would like to know if she can get enough to last until her appt on Monday. Please advise

## 2021-12-13 ENCOUNTER — Ambulatory Visit: Payer: Managed Care, Other (non HMO) | Admitting: Nurse Practitioner

## 2022-03-29 ENCOUNTER — Other Ambulatory Visit: Payer: Self-pay | Admitting: Family Medicine

## 2022-03-29 ENCOUNTER — Other Ambulatory Visit: Payer: Self-pay | Admitting: Nurse Practitioner

## 2022-03-29 DIAGNOSIS — Z1231 Encounter for screening mammogram for malignant neoplasm of breast: Secondary | ICD-10-CM

## 2022-05-08 IMAGING — MG MM DIGITAL SCREENING BILAT W/ TOMO AND CAD
8 series · 8 of 24 positions shown · non-contrast
Comparison: Previous exam(s).

CLINICAL DATA: Screening.

EXAM:
DIGITAL SCREENING BILATERAL MAMMOGRAM WITH TOMOSYNTHESIS AND CAD
TECHNIQUE: Bilateral screening digital craniocaudal and mediolateral oblique
mammograms were obtained. Bilateral screening digital breast
tomosynthesis was performed. The images were evaluated with
computer-aided detection.

[L MLO synth-2D]
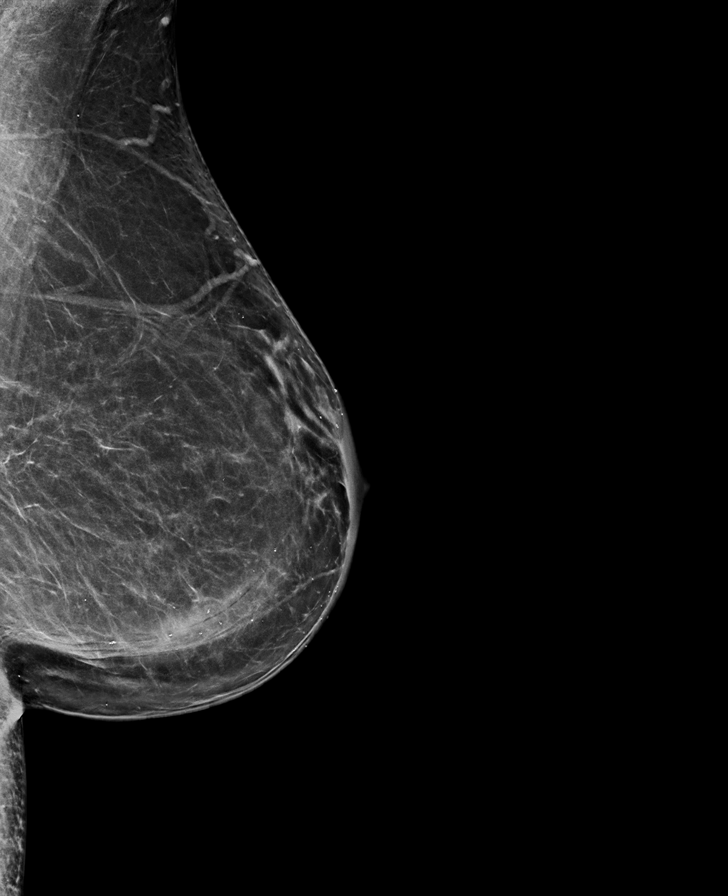

[R CC synth-2D]
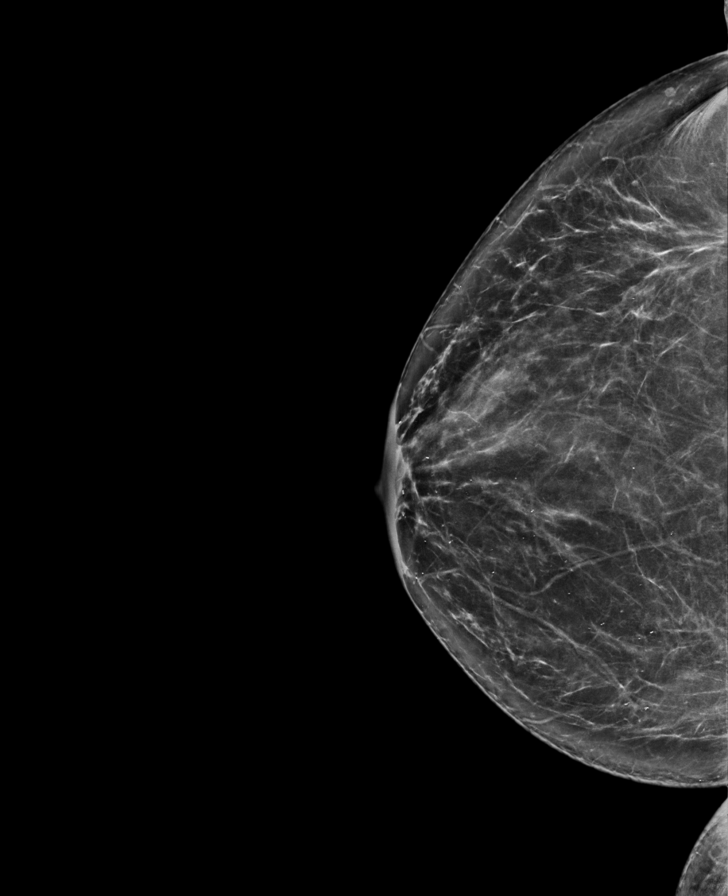

[R MLO synth-2D]
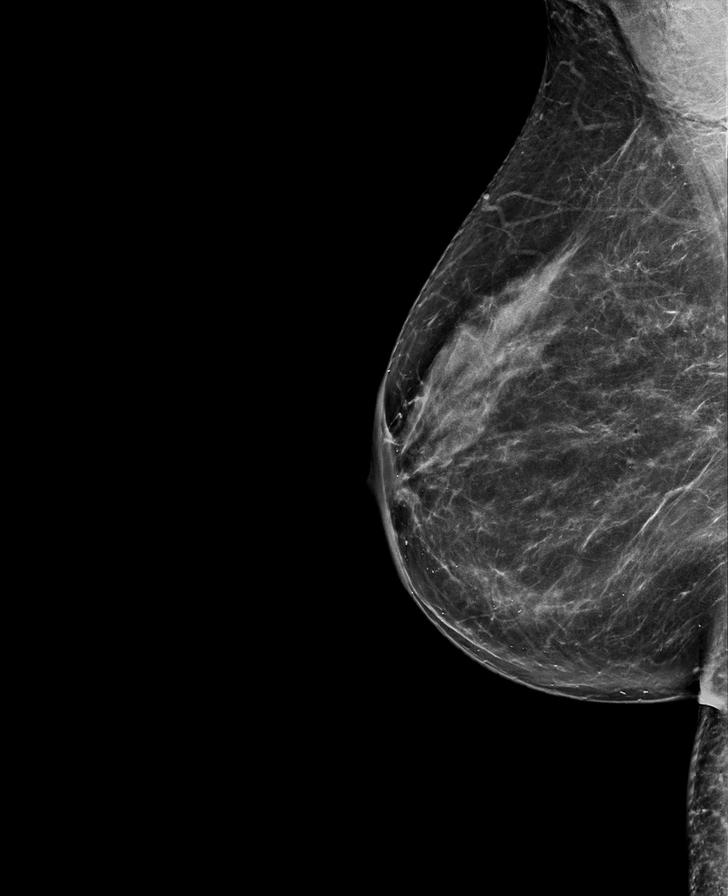

[L CC synth-2D]
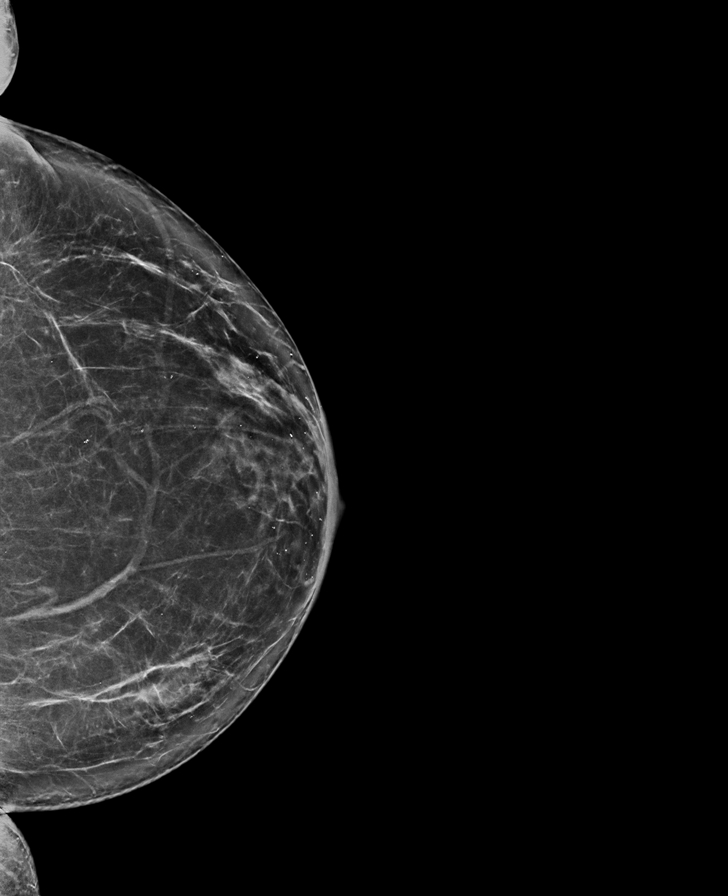

[R MLO tomo · tomo slice 39/76.0]
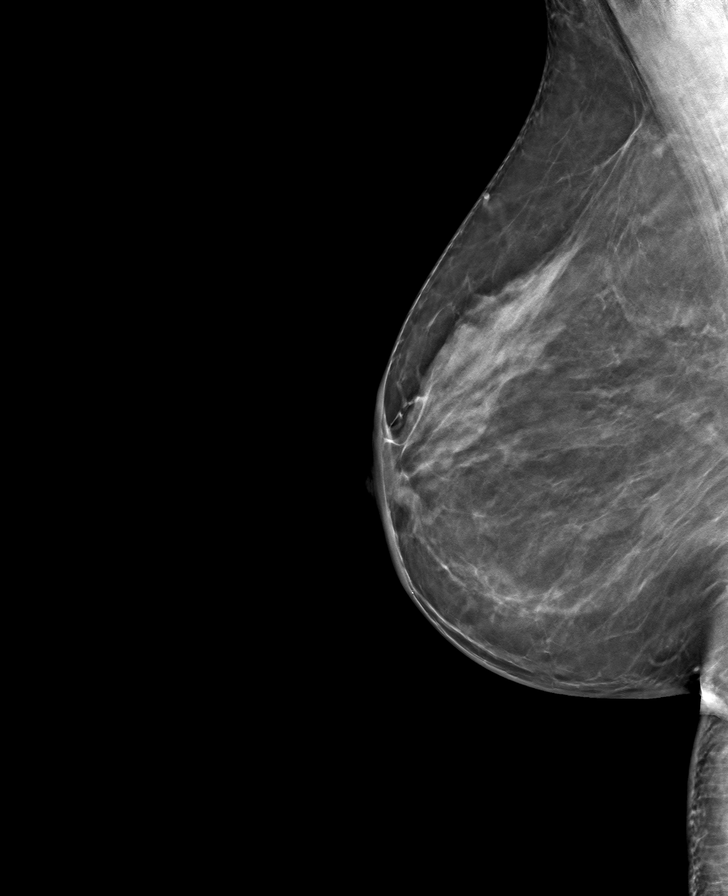

[L CC tomo · tomo slice 41/81.0]
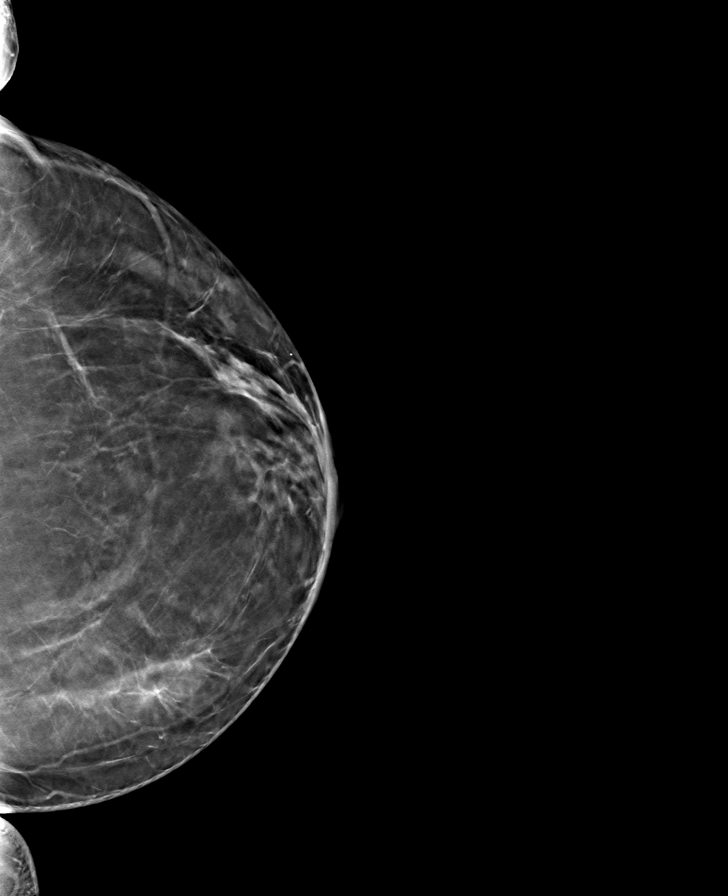

[R CC tomo · tomo slice 39/78.0]
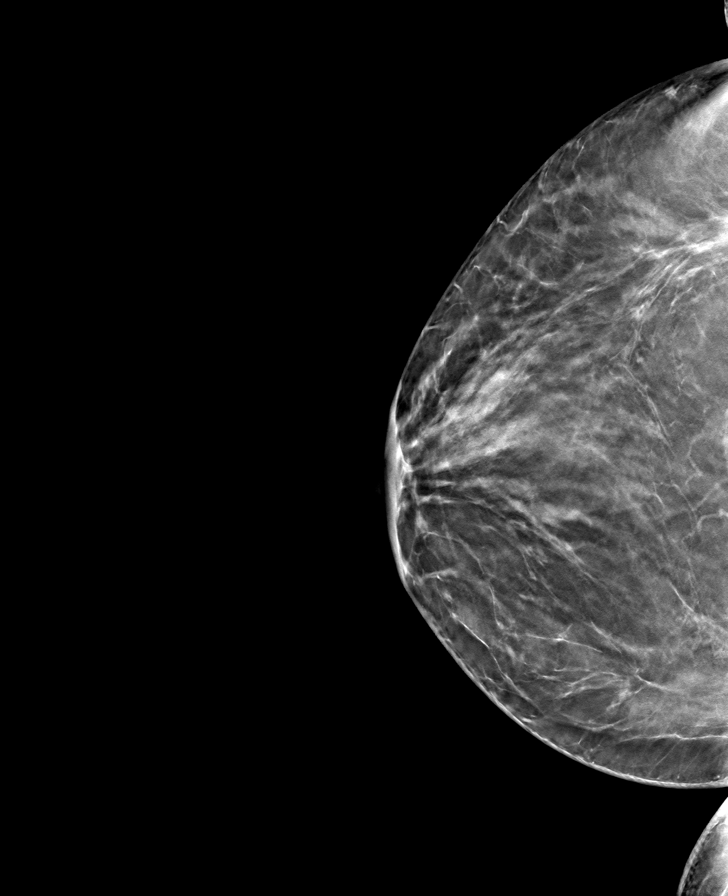

[L MLO tomo · tomo slice 40/79.0]
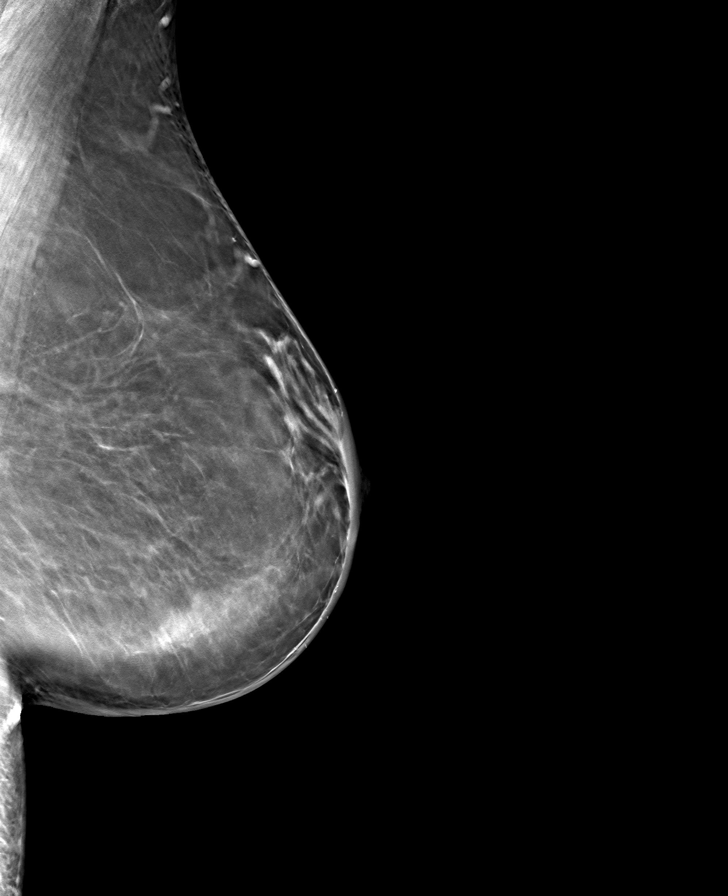

[8 of 24 positions shown; findings below may reference images not displayed]

ACR Breast Density Category b: There are scattered areas of
fibroglandular density.
FINDINGS: There are no findings suspicious for malignancy.
IMPRESSION: No mammographic evidence of malignancy. A result letter of this
screening mammogram will be mailed directly to the patient.

RECOMMENDATION:
Screening mammogram in one year. (Code:51-O-LD2)

BI-RADS CATEGORY  1: Negative.

## 2022-09-19 ENCOUNTER — Other Ambulatory Visit: Payer: Self-pay | Admitting: Family Medicine

## 2022-09-19 ENCOUNTER — Ambulatory Visit (INDEPENDENT_AMBULATORY_CARE_PROVIDER_SITE_OTHER): Payer: Managed Care, Other (non HMO) | Admitting: Family Medicine

## 2022-09-19 ENCOUNTER — Encounter: Payer: Self-pay | Admitting: Family Medicine

## 2022-09-19 VITALS — BP 154/82 | HR 70 | Ht 60.0 in | Wt 202.0 lb

## 2022-09-19 DIAGNOSIS — E559 Vitamin D deficiency, unspecified: Secondary | ICD-10-CM

## 2022-09-19 DIAGNOSIS — Z7689 Persons encountering health services in other specified circumstances: Secondary | ICD-10-CM | POA: Diagnosis not present

## 2022-09-19 DIAGNOSIS — E782 Mixed hyperlipidemia: Secondary | ICD-10-CM

## 2022-09-19 DIAGNOSIS — E0590 Thyrotoxicosis, unspecified without thyrotoxic crisis or storm: Secondary | ICD-10-CM

## 2022-09-19 DIAGNOSIS — Z0000 Encounter for general adult medical examination without abnormal findings: Secondary | ICD-10-CM

## 2022-09-19 DIAGNOSIS — R600 Localized edema: Secondary | ICD-10-CM | POA: Insufficient documentation

## 2022-09-19 DIAGNOSIS — I10 Essential (primary) hypertension: Secondary | ICD-10-CM

## 2022-09-19 DIAGNOSIS — M7661 Achilles tendinitis, right leg: Secondary | ICD-10-CM

## 2022-09-19 DIAGNOSIS — E538 Deficiency of other specified B group vitamins: Secondary | ICD-10-CM

## 2022-09-19 DIAGNOSIS — R7303 Prediabetes: Secondary | ICD-10-CM

## 2022-09-19 MED ORDER — HYDROCHLOROTHIAZIDE 25 MG PO TABS: 25.0000 mg | ORAL_TABLET | Freq: Every day | ORAL | 3 refills | Status: DC

## 2022-09-19 NOTE — Progress Notes (Signed)
Subjective:    Patient ID: Alexis Moody, female    DOB: January 18, 1976, 46 y.o.   MRN: [id number]  Alexis Moody is a 46 y.o. female presenting on 09/19/2022 for Establish Care  Establish care. Previous PCPs, husband is my patient here.  HPI  Obesity, Morbid BMI >39  Hyperthyroidism Followed by Dr Gabriel Carina [facility]), she had issue with elevated BP back in Summer 2023, GYN had lab testing that showed elevated Free T4 6.5 high number with tachycardia and warm temperature, then further testing confirmed Hyperthyroidism, and established with Endocrine - Currently improving levels of thyroid now, managed on Methimozole 5mg  daily and Propranolol 80mg  BID No significant fam history of thyroid  CHRONIC HTN: Reports now improved since treated Hyperthyroidism, controlled on HCTZ Current Meds - Hydrochlorothiazide 25mg  daily, Propranolol 80mg  TWICE A DAY for thyroid Reports good compliance, took meds today. Tolerating well, w/o complaints. Lifestyle: - Diet: Goal to limit sodium but admits not changing her diet yet. Goal to try juicing in future. - Exercise: 2 x week treadmill Admits bilateral ankle swelling, episodic Denies CP, dyspnea, HA, edema, dizziness / lightheadedness  Achilles Tendonitis R Ankle pain Reports problem for months with episodic worsening achilles tendon region pain , worse with activity at times. No new activity or exercise or physical trigger that she knows. Some swelling, no traumatic injury No foot or toe pain       09/19/2022   10:04 AM 09/20/2020    9:28 AM 02/23/2020   10:38 AM  Depression screen PHQ 2/9  Decreased Interest 3 0 0  Down, Depressed, Hopeless 0 0 0  PHQ - 2 Score 3 0 0  Altered sleeping 1  0  Tired, decreased energy 3  0  Change in appetite 0  0  Feeling bad or failure about yourself  0  0  Trouble concentrating 0  0  Moving slowly or fidgety/restless 0  0  Suicidal thoughts 0  0  PHQ-9 Score 7  0  Difficult doing work/chores Not  difficult at all  Not difficult at all    Past Medical History:  Diagnosis Date   Hyperlipidemia    Hypertension    Obesity    Obesity, Class II, BMI 35-39.9, with comorbidity    Thyroid disease    Past Surgical History:  Procedure Laterality Date   REDUCTION MAMMAPLASTY     Social History   Socioeconomic History   Marital status: Married    Spouse name: Not on file   Number of children: Not on file   Years of education: Not on file   Highest education level: Not on file  Occupational History   Not on file  Tobacco Use   Smoking status: Former    Types: Cigarettes    Start date: 11/13/1994    Quit date: 11/14/1999    Years since quitting: 22.8   Smokeless tobacco: Never  Vaping Use   Vaping Use: Former  Substance and Sexual Activity   Alcohol use: Yes    Alcohol/week: 1.0 standard drink of alcohol    Types: 1 Standard drinks or equivalent per week    Comment: occasional   Drug use: No   Sexual activity: Yes    Partners: Male    Birth control/protection: None  Other Topics Concern   Not on file  Social History Narrative   Not on file   Social Determinants of Health   Financial Resource Strain: Not on file  Food Insecurity: Not on file  Transportation Needs: Not on file  Physical Activity: Not on file  Stress: Not on file  Social Connections: Not on file  Intimate Partner Violence: Not on file   Family History  Problem Relation Age of Onset   Hyperlipidemia Mother    Hypertension Mother    Breast cancer Mother 76   Hyperlipidemia Father    Hypertension Father    Diabetes Father    Hypertension Maternal Grandfather    Multiple sclerosis Maternal Grandmother    Leukemia Cousin    Stroke Neg Hx    Heart disease Neg Hx    Current Outpatient Medications on File Prior to Visit  Medication Sig   Cholecalciferol (VITAMIN D) 2000 UNITS CAPS Take by mouth daily.    ferrous sulfate 324 MG TBEC Take 324 mg by mouth.   methimazole (TAPAZOLE) 5 MG tablet Take  by mouth.   propranolol (INDERAL) 80 MG tablet Take 80 mg by mouth 2 (two) times daily.   No current facility-administered medications on file prior to visit.    Review of Systems Per HPI unless specifically indicated above      Objective:    BP (!) 154/82 (BP Location: Left Arm, Cuff Size: Normal)   Pulse 70   Ht 5' (1.524 m)   Wt 202 lb (91.6 kg)   SpO2 99%   BMI 39.45 kg/m   Wt Readings from Last 3 Encounters:  09/19/22 202 lb (91.6 kg)  09/20/20 184 lb 12.8 oz (83.8 kg)  02/23/20 196 lb 4.8 oz (89 kg)    Physical Exam Vitals and nursing note reviewed.  Constitutional:      General: She is not in acute distress.    Appearance: Normal appearance. She is well-developed. She is obese. She is not diaphoretic.     Comments: Well-appearing, comfortable, cooperative  HENT:     Head: Normocephalic and atraumatic.  Eyes:     General:        Right eye: No discharge.        Left eye: No discharge.     Conjunctiva/sclera: Conjunctivae normal.  Cardiovascular:     Rate and Rhythm: Normal rate.  Pulmonary:     Effort: Pulmonary effort is normal.  Musculoskeletal:     Comments: Localized R achilles tendon area of pain but not tender today. No edema or deformity  Skin:    General: Skin is warm and dry.     Findings: No erythema or rash.  Neurological:     Mental Status: She is alert and oriented to person, place, and time.  Psychiatric:        Mood and Affect: Mood normal.        Behavior: Behavior normal.        Thought Content: Thought content normal.     Comments: Well groomed, good eye contact, normal speech and thoughts    Results for orders placed or performed in visit on 02/23/20  Hemoglobin A1c  Result Value Ref Range   Hgb A1c MFr Bld 5.7 (H) 4.8 - 5.6 %   Est. average glucose Bld gHb Est-mCnc 117 mg/dL  Lipid panel  Result Value Ref Range   Cholesterol, Total 149 100 - 199 mg/dL   Triglycerides 81 0 - 149 mg/dL   HDL 43 >03 mg/dL   VLDL Cholesterol Cal  16 5 - 40 mg/dL   LDL Chol Calc (NIH) 90 0 - 99 mg/dL   Chol/HDL Ratio 3.5 0.0 - 4.4 ratio  Comprehensive metabolic panel  Result Value Ref Range   Glucose 87 65 - 99 mg/dL   BUN 12 6 - 24 mg/dL   Creatinine, Ser 6.44 (L) 0.57 - 1.00 mg/dL   GFR calc non Af Amer 116 >59 mL/min/1.73   GFR calc Af Amer 134 >59 mL/min/1.73   BUN/Creatinine Ratio 22 9 - 23   Sodium 140 134 - 144 mmol/L   Potassium 3.6 3.5 - 5.2 mmol/L   Chloride 101 96 - 106 mmol/L   CO2 23 20 - 29 mmol/L   Calcium 9.8 8.7 - 10.2 mg/dL   Total Protein 6.2 6.0 - 8.5 g/dL   Albumin 3.9 3.8 - 4.8 g/dL   Globulin, Total 2.3 1.5 - 4.5 g/dL   Albumin/Globulin Ratio 1.7 1.2 - 2.2   Bilirubin Total 0.5 0.0 - 1.2 mg/dL   Alkaline Phosphatase 87 39 - 117 IU/L   AST 17 0 - 40 IU/L   ALT 16 0 - 32 IU/L  Iron, TIBC and Ferritin Panel  Result Value Ref Range   Total Iron Binding Capacity 325 250 - 450 ug/dL   UIBC 034 742 - 595 ug/dL   Iron 65 27 - 638 ug/dL   Iron Saturation 20 15 - 55 %   Ferritin 123 15 - 150 ng/mL  CBC with Differential/Platelet  Result Value Ref Range   WBC 7.4 3.4 - 10.8 x10E3/uL   RBC 4.69 3.77 - 5.28 x10E6/uL   Hemoglobin 12.0 11.1 - 15.9 g/dL   Hematocrit 75.6 43.3 - 46.6 %   MCV 76 (L) 79 - 97 fL   MCH 25.6 (L) 26.6 - 33.0 pg   MCHC 33.5 31.5 - 35.7 g/dL   RDW 29.5 18.8 - 41.6 %   Platelets 191 150 - 450 x10E3/uL   Neutrophils 49 Not Estab. %   Lymphs 33 Not Estab. %   Monocytes 15 Not Estab. %   Eos 3 Not Estab. %   Basos 0 Not Estab. %   Neutrophils Absolute 3.6 1.4 - 7.0 x10E3/uL   Lymphocytes Absolute 2.4 0.7 - 3.1 x10E3/uL   Monocytes Absolute 1.1 (H) 0.1 - 0.9 x10E3/uL   EOS (ABSOLUTE) 0.2 0.0 - 0.4 x10E3/uL   Basophils Absolute 0.0 0.0 - 0.2 x10E3/uL   Immature Granulocytes 0 Not Estab. %   Immature Grans (Abs) 0.0 0.0 - 0.1 x10E3/uL      Assessment & Plan:   Problem List Items Addressed This Visit     Achilles tendinitis of right lower extremity   Bilateral lower extremity  edema   Relevant Medications   hydrochlorothiazide (HYDRODIURIL) 25 MG tablet   Essential hypertension - Primary   Relevant Medications   propranolol (INDERAL) 80 MG tablet   hydrochlorothiazide (HYDRODIURIL) 25 MG tablet   Hyperthyroidism   Relevant Medications   methimazole (TAPAZOLE) 5 MG tablet   propranolol (INDERAL) 80 MG tablet   Other Visit Diagnoses     Encounter to establish care with new doctor           Morbid Obesity BMI >39 with comorbid conditions, HYPERTENSION, hyperthyroidism Discussed weight management strategy, she plans to do with diet and exercise Consider Noom, Weight watchers or other program  For HYPERTENSION Continue current HCTZ 25mg  daily and continue propranolol 80 TWICE A DAY per Endocrine  Goal to limit sodium intake  Overall weight loss will help back pain and ankle and BP  Goal with BP < 140 / 90, and then the stretch goal < 135 / 85 or better. Recommend  check BP 1-2 times a week.  Not candidate for Amlodipine due to edema. Consider ARB next line. If needed.  Achilles Tendonitis R side  RICE therapy  START anti inflammatory topical - OTC Voltaren (generic Diclofenac) topical 2-4 times a day as needed for pain swelling of affected joint for 1-2 weeks or longer.  If severe pain can use ibuprofen or aleve, follow instructions for 1-3 days or something.  Caution with overuse repetitive stairs etc  Stretching / rehab for Achilles Tendonitis  Future sports medicine referral if indicated  Meds ordered this encounter  Medications   hydrochlorothiazide (HYDRODIURIL) 25 MG tablet    Sig: Take 1 tablet (25 mg total) by mouth daily.    Dispense:  90 tablet    Refill:  3      Follow up plan: Return in about 3 months (around 12/20/2022) for 3 month Annual Physical ([facility] orders ahead of time).  Saralyn Pilar, DO [facility] 09/19/2022, 10:10 AM

## 2022-09-19 NOTE — Patient Instructions (Addendum)
Thank you for coming to the office today.  Goal to limit sodium intake  Overall weight loss will help back pain and ankle and BP  Noom, weight watchers.  Goal with BP < 140 / 90, and then the stretch goal < 135 / 85 or better. Recommend check BP 1-2 times a week.  Achilles Tendonitis  Use RICE therapy: - R - Rest / relative rest with activity modification avoid overuse of joint - I - Ice packs (make sure you use a towel or sock / something to protect skin) - C - Compression with ACE wrap or Ankle ASO Brace to apply pressure and reduce swelling allowing more support - E - Elevation - if significant swelling, lift leg above heart level (toes above your nose) to help reduce swelling, most helpful at night after day of being on your feet  START anti inflammatory topical - OTC Voltaren (generic Diclofenac) topical 2-4 times a day as needed for pain swelling of affected joint for 1-2 weeks or longer.  If severe pain can use ibuprofen or aleve, follow instructions for 1-3 days or something.  Caution with overuse repetitive stairs etc  Stretching / rehab for Achilles Tendonitis  -------------------------------  DUE for FASTING BLOOD WORK (no food or drink after midnight before the lab appointment, only water or coffee without cream/sugar on the morning of)  3 months [facility]  - Make sure Lab Only appointment is at about 1 week before your next appointment, so that results will be available  For Lab Results, once available within 2-3 days of blood draw, you can can log in to MyChart online to view your results and a brief explanation. Also, we can discuss results at next follow-up visit.   Please schedule a Follow-up Appointment to: Return in about 3 months (around 12/20/2022) for 3 month Annual Physical ([facility] orders ahead of time).  If you have any other questions or concerns, please feel free to call the office or send a message through MyChart. You may also schedule an earlier  appointment if necessary.  Additionally, you may be receiving a survey about your experience at our office within a few days to 1 week by e-mail or mail. We value your feedback.  Saralyn Pilar, DO [facility], [facility]  Achilles Tendinitis Rehab Ask your health care provider which exercises are safe for you. Do exercises exactly as told by your health care provider and adjust them as directed. It is normal to feel mild stretching, pulling, tightness, or discomfort as you do these exercises. Stop right away if you feel sudden pain or your pain gets worse. Do not begin these exercises until told by your health care provider. Stretching and range-of-motion exercises These exercises warm up your muscles and joints and improve the movement and flexibility of your ankle. These exercises also help to relieve pain. Standing wall calf stretch with straight knee  Stand with your hands against a wall. Extend your left / right leg behind you, and bend your front knee slightly. Keep both of your heels on the floor. Point the toes of your back foot slightly inward. Keeping your heels on the floor and your back knee straight, shift your weight toward the wall. Do not allow your back to arch. You should feel a gentle stretch in your upper calf. Hold this position for __________ seconds. Repeat __________ times. Complete this exercise __________ times a day. Standing wall calf stretch with bent knee Stand with your hands against a wall.  Extend your left / right leg behind you, and bend your front knee slightly. Keep both of your heels on the floor. Point the toes of your back foot slightly inward. Keeping your heels on the floor, bend your back knee slightly. You should feel a gentle stretch deep in your lower calf near your heel. Hold this position for __________ seconds. Repeat __________ times. Complete this exercise __________ times a day. Strengthening exercises These exercises  build strength and control of your ankle. Endurance is the ability to use your muscles for a long time, even after they get tired. Plantar flexion with band In this exercise, you push your toes downward, away from you, with an exercise band providing resistance. Sit on the floor with your left / right leg extended. You may put a pillow under your calf to give your foot more room to move. Loop a rubber exercise band or tube around the ball of your left / right foot. The ball of your foot is on the walking surface, right under your toes. The band or tube should be slightly tense when your foot is relaxed. If the band or tube slips, you can put on your shoe or put a washcloth between the band and your foot to help it stay in place. Slowly point your toes downward, pushing them away from you (plantar flexion). Hold this position for __________ seconds. Slowly release the tension in the band or tube, controlling smoothly until your foot is back to the starting position. Repeat steps 1-5 with your left / right leg. Repeat __________ times. Complete this exercise __________ times a day. Eccentric heel drop  In this exercise, you stand and slowly raise your heel and then slowly lower it. This exercise lengthens the calf muscles (eccentric) while the heel bears weight. If this exercise is too easy, try doing it while wearing a backpack with weights in it. Stand on a step with the balls of your feet. The ball of your foot is on the walking surface, right under your toes. Do not put your heels on the step. For balance, rest your hands on the wall or on a railing. Rise up onto the balls of your feet. Keeping your heels up, shift all of your weight to your left / right leg and pick up your other leg. Slowly lower your left / right leg so your heel drops below the level of the step. Put down your other foot before returning to the start position. If told by your health care provider, build up to: 3 sets of 15  repetitions while keeping your knees straight. 3 sets of 15 repetitions while keeping your knees slightly bent as far as told by your health care provider. Repeat __________ times. Complete this exercise __________ times a day. Balance exercises These exercises improve or maintain your balance. Balance is important in preventing falls. Single leg stand If this exercise is too easy, you can try it with your eyes closed or while standing on a pillow. Without shoes, stand near a railing or in a door frame. Hold on to the railing or door frame as needed. Stand on your left / right foot. Keep your big toe down on the floor and try to keep your arch lifted. Hold this position for __________ seconds. Repeat __________ times. Complete this exercise __________ times a day. This information is not intended to replace advice given to you by your health care provider. Make sure you discuss any questions you have with your  health care provider. Document Revised: 12/21/2020 Document Reviewed: 12/21/2020 Elsevier Patient Education  2023 [employer]   ----------------             Low Back Pain Exercises  See other page with pictures of each exercise.  Start with 1 or 2 of these exercises that you are most comfortable with. Do not do any exercises that cause you significant worsening pain. Some of these may cause some "stretching soreness" but it should go away after you stop the exercise, and get better over time. Gradually increase up to 3-4 exercises as tolerated.  Standing hamstring stretch: Place the heel of your leg on a stool about 15 inches high. Keep your knee straight. Lean forward, bending at the hips until you feel a mild stretch in the back of your thigh. Make sure you do not roll your shoulders and bend at the waist when doing this or you will stretch your lower back instead. Hold the stretch for 15 to 30 seconds. Repeat 3 times. Repeat the same stretch on your other leg.  Cat  and camel: Get down on your hands and knees. Let your stomach sag, allowing your back to curve downward. Hold this position for 5 seconds. Then arch your back and hold for 5 seconds. Do 3 sets of 10.  Quadriped Arm/Leg Raises: Get down on your hands and knees. Tighten your abdominal muscles to stiffen your spine. While keeping your abdominals tight, raise one arm and the opposite leg away from you. Hold this position for 5 seconds. Lower your arm and leg slowly and alternate sides. Do this 10 times on each side.  Pelvic tilt: Lie on your back with your knees bent and your feet flat on the floor. Tighten your abdominal muscles and push your lower back into the floor. Hold this position for 5 seconds, then relax. Do 3 sets of 10.  Partial curl: Lie on your back with your knees bent and your feet flat on the floor. Tighten your stomach muscles and flatten your back against the floor. Tuck your chin to your chest. With your hands stretched out in front of you, curl your upper body forward until your shoulders clear the floor. Hold this position for 3 seconds. Don't hold your breath. It helps to breathe out as you lift your shoulders up. Relax. Repeat 10 times. Build to 3 sets of 10. To challenge yourself, clasp your hands behind your head and keep your elbows out to the side.  Lower trunk rotation: Lie on your back with your knees bent and your feet flat on the floor. Tighten your abdominal muscles and push your lower back into the floor. Keeping your shoulders down flat, gently rotate your legs to one side, then the other as far as you can. Repeat 10 to 20 times.  Single knee to chest stretch: Lie on your back with your legs straight out in front of you. Bring one knee up to your chest and grasp the back of your thigh. Pull your knee toward your chest, stretching your buttock muscle. Hold this position for 15 to 30 seconds and return to the starting position. Repeat 3 times on each side.  Double knee to  chest: Lie on your back with your knees bent and your feet flat on the floor. Tighten your abdominal muscles and push your lower back into the floor. Pull both knees up to your chest. Hold for 5 seconds and repeat 10 to 20 times.

## 2022-11-08 ENCOUNTER — Ambulatory Visit: Payer: Managed Care, Other (non HMO) | Admitting: Family Medicine

## 2022-12-14 LAB — VITAMIN B12: Vitamin B-12: 479 pg/mL (ref 232–1245)

## 2022-12-14 LAB — CBC WITH DIFFERENTIAL/PLATELET
Basophils Absolute: 0 10*3/uL (ref 0.0–0.2)
Basos: 0 %
EOS (ABSOLUTE): 0.3 10*3/uL (ref 0.0–0.4)
Eos: 5 %
Hematocrit: 36.8 % (ref 34.0–46.6)
Hemoglobin: 11.8 g/dL (ref 11.1–15.9)
Immature Grans (Abs): 0 10*3/uL (ref 0.0–0.1)
Immature Granulocytes: 0 %
Lymphocytes Absolute: 2.9 10*3/uL (ref 0.7–3.1)
Lymphs: 43 %
MCH: 25.9 pg — ABNORMAL LOW (ref 26.6–33.0)
MCHC: 32.1 g/dL (ref 31.5–35.7)
MCV: 81 fL (ref 79–97)
Monocytes Absolute: 0.7 10*3/uL (ref 0.1–0.9)
Monocytes: 10 %
Neutrophils Absolute: 2.9 10*3/uL (ref 1.4–7.0)
Neutrophils: 42 %
Platelets: 209 10*3/uL (ref 150–450)
RBC: 4.56 x10E6/uL (ref 3.77–5.28)
RDW: 14.3 % (ref 11.7–15.4)
WBC: 6.8 10*3/uL (ref 3.4–10.8)

## 2022-12-14 LAB — COMPREHENSIVE METABOLIC PANEL
ALT: 12 IU/L (ref 0–32)
AST: 13 IU/L (ref 0–40)
Albumin/Globulin Ratio: 1.6 (ref 1.2–2.2)
Albumin: 4.1 g/dL (ref 3.9–4.9)
Alkaline Phosphatase: 116 IU/L (ref 44–121)
BUN/Creatinine Ratio: 17 (ref 9–23)
BUN: 16 mg/dL (ref 6–24)
Bilirubin Total: 0.3 mg/dL (ref 0.0–1.2)
CO2: 24 mmol/L (ref 20–29)
Calcium: 9.1 mg/dL (ref 8.7–10.2)
Chloride: 104 mmol/L (ref 96–106)
Creatinine, Ser: 0.94 mg/dL (ref 0.57–1.00)
Globulin, Total: 2.5 g/dL (ref 1.5–4.5)
Glucose: 105 mg/dL — ABNORMAL HIGH (ref 70–99)
Potassium: 4.1 mmol/L (ref 3.5–5.2)
Sodium: 140 mmol/L (ref 134–144)
Total Protein: 6.6 g/dL (ref 6.0–8.5)
eGFR: 76 mL/min/{1.73_m2} (ref >59)

## 2022-12-14 LAB — HEMOGLOBIN A1C
Est. average glucose Bld gHb Est-mCnc: 120 mg/dL
Hgb A1c MFr Bld: 5.8 % — ABNORMAL HIGH (ref 4.8–5.6)

## 2022-12-14 LAB — LIPID PANEL
Chol/HDL Ratio: 3.9 ratio (ref 0.0–4.4)
Cholesterol, Total: 186 mg/dL (ref 100–199)
HDL: 48 mg/dL (ref >39)
LDL Chol Calc (NIH): 121 mg/dL — ABNORMAL HIGH (ref 0–99)
Triglycerides: 93 mg/dL (ref 0–149)
VLDL Cholesterol Cal: 17 mg/dL (ref 5–40)

## 2022-12-14 LAB — VITAMIN D 25 HYDROXY (VIT D DEFICIENCY, FRACTURES): Vit D, 25-Hydroxy: 36.4 ng/mL (ref 30.0–100.0)

## 2022-12-18 ENCOUNTER — Encounter: Payer: Self-pay | Admitting: Family Medicine

## 2022-12-18 ENCOUNTER — Ambulatory Visit (INDEPENDENT_AMBULATORY_CARE_PROVIDER_SITE_OTHER): Payer: 59 | Admitting: Family Medicine

## 2022-12-18 VITALS — BP 152/78 | HR 118 | Ht 60.0 in | Wt 211.0 lb

## 2022-12-18 DIAGNOSIS — I10 Essential (primary) hypertension: Secondary | ICD-10-CM

## 2022-12-18 DIAGNOSIS — R7303 Prediabetes: Secondary | ICD-10-CM

## 2022-12-18 DIAGNOSIS — E0590 Thyrotoxicosis, unspecified without thyrotoxic crisis or storm: Secondary | ICD-10-CM

## 2022-12-18 DIAGNOSIS — M62830 Muscle spasm of back: Secondary | ICD-10-CM

## 2022-12-18 DIAGNOSIS — Z0000 Encounter for general adult medical examination without abnormal findings: Secondary | ICD-10-CM

## 2022-12-18 DIAGNOSIS — Z1211 Encounter for screening for malignant neoplasm of colon: Secondary | ICD-10-CM | POA: Diagnosis not present

## 2022-12-18 DIAGNOSIS — E782 Mixed hyperlipidemia: Secondary | ICD-10-CM

## 2022-12-18 DIAGNOSIS — E559 Vitamin D deficiency, unspecified: Secondary | ICD-10-CM

## 2022-12-18 MED ORDER — BACLOFEN 10 MG PO TABS: 5.0000 mg | ORAL_TABLET | Freq: Three times a day (TID) | ORAL | 1 refills | Status: DC | PRN

## 2022-12-18 NOTE — Progress Notes (Signed)
Subjective:    Patient ID: Alexis Moody, female    DOB: 16-Nov-1975, 47 y.o.   MRN: [id number]  Alexis Moody is a 47 y.o. female presenting on 12/18/2022 for Annual Exam   HPI  Here for Annual Physical and Lab Review.  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 She is proceeding with fruit fasting diet regimen  Hyperthyroidism Followed by Dr Gabriel Carina [facility] Endocrinology), she had issue with elevated BP back in Summer 2023, GYN had lab testing that showed elevated Free T4 6.5 high number with tachycardia and warm temperature, then further testing confirmed Hyperthyroidism, and established with Endocrine - Currently improving levels of thyroid now, managed on Methimozole 5mg  daily - was on Propranolol now off due to intolerance side effect No significant fam history of thyroid   Life stressors with grandfather passed She did stop Propranolol 80mg  TWICE A DAY previously She held the methimazole previously and then contacted Dr Gabriel Carina and resumed the methimazole  Low back symptoms flared up at times with back spasms of muscle  CHRONIC HTN: Reports now improved since treated Hyperthyroidism, controlled on HCTZ Current Meds - Hydrochlorothiazide 25mg  daily - OFF Propranolol 80mg  TWICE A DAY for thyroid due to side effect intolerance Reports good compliance, took meds today. Tolerating well, w/o complaints. Lifestyle: - Diet: Goal to limit sodium but admits not changing her diet yet. Goal to try juicing in future. - Exercise: 2 x week treadmill Admits bilateral ankle swelling, episodic Denies CP, dyspnea, HA, edema, dizziness / lightheadedness   PMH Achilles Tendonitis R Ankle pain    Health Maintenance:  Followed by Rayne Du, last seen by Drinda Butts CNM 03/27/22, but last pap smear collected was 09/28/20 - negative, negative HPV - abstract  Colon CA Screening - ordered Cologuard, she will check cost/coverage and  proceed, no prior testing. And no fam history. asymptomatic      09/19/2022   10:04 AM 09/20/2020    9:28 AM 02/23/2020   10:38 AM  Depression screen PHQ 2/9  Decreased Interest 3 0 0  Down, Depressed, Hopeless 0 0 0  PHQ - 2 Score 3 0 0  Altered sleeping 1  0  Tired, decreased energy 3  0  Change in appetite 0  0  Feeling bad or failure about yourself  0  0  Trouble concentrating 0  0  Moving slowly or fidgety/restless 0  0  Suicidal thoughts 0  0  PHQ-9 Score 7  0  Difficult doing work/chores Not difficult at all  Not difficult at all    Past Medical History:  Diagnosis Date   Hyperlipidemia    Hypertension    Obesity    Obesity, Class II, BMI 35-39.9, with comorbidity    Thyroid disease    Past Surgical History:  Procedure Laterality Date   REDUCTION MAMMAPLASTY     Social History   Socioeconomic History   Marital status: Married    Spouse name: Not on file   Number of children: Not on file   Years of education: Not on file   Highest education level: Not on file  Occupational History   Not on file  Tobacco Use   Smoking status: Former    Types: Cigarettes    Start date: 11/13/1994    Quit date: 11/14/1999    Years since quitting: 23.1   Smokeless tobacco: Never  Vaping Use   Vaping Use: Former  Substance and Sexual Activity   Alcohol use: Yes    Alcohol/week: 1.0 standard drink of alcohol    Types: 1 Standard drinks or equivalent per week    Comment: occasional   Drug use: No   Sexual activity: Yes    Partners: Male    Birth control/protection: None  Other Topics Concern   Not on file  Social History Narrative   Not on file   Social Determinants of Health   Financial Resource Strain: Not on file  Food Insecurity: Not on file  Transportation Needs: Not on file  Physical Activity: Not on file  Stress: Not on file  Social Connections: Not on file  Intimate Partner Violence: Not on file   Family History  Problem Relation Age of Onset    Hyperlipidemia Mother    Hypertension Mother    Breast cancer Mother 19   Hyperlipidemia Father    Hypertension Father    Diabetes Father    Hypertension Maternal Grandfather    Multiple sclerosis Maternal Grandmother    Leukemia Cousin    Stroke Neg Hx    Heart disease Neg Hx    Current Outpatient Medications on File Prior to Visit  Medication Sig   Cholecalciferol (VITAMIN D) 2000 UNITS CAPS Take by mouth daily.    ferrous sulfate 324 MG TBEC Take 324 mg by mouth.   hydrochlorothiazide (HYDRODIURIL) 25 MG tablet Take 1 tablet (25 mg total) by mouth daily.   methimazole (TAPAZOLE) 5 MG tablet Take by mouth.   propranolol (INDERAL) 80 MG tablet Take 80 mg by mouth 2 (two) times daily. (Patient not taking: Reported on 12/18/2022)   No current facility-administered medications on file prior to visit.    Review of Systems  Constitutional:  Negative for activity change, appetite change, chills, diaphoresis, fatigue and fever.  HENT:  Negative for congestion and hearing loss.   Eyes:  Negative for visual disturbance.  Respiratory:  Negative for cough, chest tightness, shortness of breath and wheezing.   Cardiovascular:  Negative for chest pain, palpitations and leg swelling.  Gastrointestinal:  Negative for abdominal pain, constipation, diarrhea, nausea and vomiting.  Genitourinary:  Negative for dysuria, frequency and hematuria.  Musculoskeletal:  Positive for back pain. Negative for arthralgias and neck pain.  Skin:  Negative for rash.  Neurological:  Negative for dizziness, weakness, light-headedness, numbness and headaches.  Hematological:  Negative for adenopathy.  Psychiatric/Behavioral:  Negative for behavioral problems, dysphoric mood and sleep disturbance.    Per HPI unless specifically indicated above      Objective:    BP (!) 152/78   Pulse (!) 118   Ht 5' (1.524 m)   Wt 211 lb (95.7 kg)   SpO2 100%   BMI 41.21 kg/m   Wt Readings from Last 3 Encounters:   12/18/22 211 lb (95.7 kg)  09/19/22 202 lb (91.6 kg)  09/20/20 184 lb 12.8 oz (83.8 kg)    Physical Exam Vitals and nursing note reviewed.  Constitutional:      General: She is not in acute distress.    Appearance: She is well-developed. She is obese. She is not diaphoretic.     Comments: Well-appearing, comfortable, cooperative  HENT:     Head: Normocephalic and atraumatic.  Eyes:     General:        Right eye: No discharge.        Left eye: No discharge.     Conjunctiva/sclera: Conjunctivae normal.     Pupils: Pupils are  equal, round, and reactive to light.  Neck:     Thyroid: No thyromegaly.  Cardiovascular:     Rate and Rhythm: Normal rate and regular rhythm.     Pulses: Normal pulses.     Heart sounds: Normal heart sounds. No murmur heard. Pulmonary:     Effort: Pulmonary effort is normal. No respiratory distress.     Breath sounds: Normal breath sounds. No wheezing or rales.  Abdominal:     General: Bowel sounds are normal. There is no distension.     Palpations: Abdomen is soft. There is no mass.     Tenderness: There is no abdominal tenderness.  Musculoskeletal:        General: No tenderness. Normal range of motion.     Cervical back: Normal range of motion and neck supple.     Right lower leg: No edema.     Left lower leg: No edema.     Comments: Upper / Lower Extremities: - Normal muscle tone, strength bilateral upper extremities 5/5, lower extremities 5/5  Lymphadenopathy:     Cervical: No cervical adenopathy.  Skin:    General: Skin is warm and dry.     Findings: No erythema or rash.  Neurological:     Mental Status: She is alert and oriented to person, place, and time.     Comments: Distal sensation intact to light touch all extremities  Psychiatric:        Mood and Affect: Mood normal.        Behavior: Behavior normal.        Thought Content: Thought content normal.     Comments: Well groomed, good eye contact, normal speech and thoughts        Results for orders placed or performed in visit on 12/18/22  HM PAP SMEAR  Result Value Ref Range   HM Pap smear Negative NILM, Negative high risk HPV       Assessment & Plan:   Problem List Items Addressed This Visit     Pre-diabetes (Chronic)   Essential hypertension   HLD (hyperlipidemia)   Hyperthyroidism   Other Visit Diagnoses     Annual physical exam    -  Primary   Screening for colon cancer       Relevant Orders   Cologuard   Vitamin D deficiency       Muscle spasm of back       Relevant Medications   baclofen (LIORESAL) 10 MG tablet       Updated Health Maintenance information Reviewed recent lab results with patient Encouraged improvement to lifestyle with diet and exercise Goal of weight loss  PreDM A1c 5.8, stable from previous readings Diet changes goals to manage Less active since cannot exercise with hyperthyroid issue  #Hyperthyroidism Followed by [facility] Endocrine Dr Tedd Sias Now off Propranolol had side effect. Having higher BP and HR, may need to restart beta blocker, she will discuss with Dr Tedd Sias  I think you may need to resume additional therapy first Let me know if you need my help after her visit if you still have higher BP on readings  #Back Spasms Seem MSK complaints with less activity and some med side effect on BB? Trial Baclofen 5-10mg  THREE TIMES A DAY AS NEEDED caution sedation  Due for routine colon cancer screening. Never had colonoscopy (not interested), no family history colon cancer. - Discussion today about recommendations for either Colonoscopy or Cologuard screening, benefits and risks of screening, interested in Cologuard, understands  that if positive then recommendation is for diagnostic colonoscopy to follow-up. - Ordered Cologuard today / she will check cost / coverage   Orders Placed This Encounter  Procedures   Cologuard   HM PAP SMEAR    This external order was created through the Results Console.      Meds ordered this encounter  Medications   baclofen (LIORESAL) 10 MG tablet    Sig: Take 0.5-1 tablets (5-10 mg total) by mouth 3 (three) times daily as needed for muscle spasms.    Dispense:  30 each    Refill:  1     Follow up plan: Return in about 6 months (around 06/18/2023) for 6 month follow-up PreDM A1c, HTN updates Thyroid.  Nobie Putnam, [facility] 12/18/2022, 11:21 AM

## 2022-12-18 NOTE — Patient Instructions (Addendum)
Thank you for coming to the office today.  Recent Labs    12/13/22 1040  HGBA1C 5.8*   A1c has stabilized lower ranger 5.7 to 5.8. this is okay, keep improving diet and this should keep improving  I do think the thyroid is raising the blood pressure and heart rate Discuss w Dr Gabriel Carina on med options  I think you may need to resume additional therapy first Let me know if you need my help after her visit if you still have higher BP on readings   Start taking Baclofen (Lioresal) 10mg  (muscle relaxant) - start with half (cut) to one whole pill at night as needed for next 1-3 nights (may make you drowsy, caution with driving) see how it affects you, then if tolerated increase to one pill 2 to 3 times a day or (every 8 hours as needed)  Diet Recommendations for  Preventin Diabetes   REDUCE Starchy (carb) foods include: Bread, rice, pasta, potatoes, corn, crackers, bagels, muffins, all baked goods.   FRUITS - LIMIT these HIGH sugar/carb fruits = Pineapple, Watermelon, Bananas - OKAY with these MEDIUM sugar/carb fruits = Citrus, Oranges, Grapes - PREFER these LOW sugar/carb fruits = Apples, Berries, Pears, Plums  Protein foods include: Meat, fish, poultry, eggs, dairy foods, and beans such as pinto and kidney beans (beans also provide carbohydrate).   1. Eat at least 3 meals and 1-2 snacks per day. Never go more than 4-5 hours while awake without eating.   2. Limit starchy foods to TWO per meal and ONE per snack. ONE portion of a starchy  food is equal to the following:   - ONE slice of bread (or its equivalent, such as half of a hamburger bun).   - 1/2 cup of a "scoopable" starchy food such as potatoes or rice.   - 1 OUNCE (28 grams) of starchy snacks (crackers or pretzels, look on label).   - 15 grams of carbohydrate as shown on food label.   3. Both lunch and dinner should include a protein food, a carb food, and vegetables.   - Obtain twice as many veg's as protein or carbohydrate  foods for both lunch and dinner.   - Try to keep frozen veg's on hand for a quick vegetable serving.     - Fresh or frozen veg's are best.   4. Breakfast should always include protein.     ----------------------------  Colon Cancer Screening: - For all adults age 28+ routine colon cancer screening is highly recommended.     - Recent guidelines from [place] recommend starting age of 30 - Early detection of colon cancer is important, because often there are no warning signs or symptoms, also if found early usually it can be cured. Late stage is hard to treat.  - If you are not interested in Colonoscopy screening (if done and normal you could be cleared for 5 to 10 years until next due), then Cologuard is an excellent alternative for screening test for Colon Cancer. It is highly sensitive for detecting DNA of colon cancer from even the earliest stages. Also, there is NO bowel prep required. - If Cologuard is NEGATIVE, then it is good for 3 years before next due - If Cologuard is POSITIVE, then it is strongly advised to get a Colonoscopy, which allows the GI doctor to locate the source of the cancer or polyp (even very early stage) and treat it by removing it. ------------------------- If you would like to proceed with  Cologuard (stool DNA test) - FIRST, call your insurance company and tell them you want to check cost of Cologuard tell them CPT Code [phone] (it may be completely covered and you could get for no cost, OR max cost without any coverage is about $600). Also, keep in mind if you do NOT open the kit, and decide not to do the test, you will NOT be charged, you should contact the company if you decide not to do the test. - If you want to proceed, you can notify us (phone message, [place], or at next visit) and we will order it for you. The test kit will be delivered to you house within about 1 week. Follow instructions to collect sample, you may call the company for any  help or questions, 24/7 telephone support at [phone].  IF/WHEN we order it:  It will be shipped to you directly. If not received in 2-4 weeks, call us or the company.   If you send it back and no results are received in 2-4 weeks, call us or the company as well!   Colon Cancer Screening: - For all adults age 48+ routine colon cancer screening is highly recommended.     - Recent guidelines from [place] recommend starting age of 24 - Early detection of colon cancer is important, because often there are no warning signs or symptoms, also if found early usually it can be cured. Late stage is hard to treat.   - If Cologuard is NEGATIVE, then it is good for 3 years before next due - If Cologuard is POSITIVE, then it is strongly advised to get a Colonoscopy, which allows the GI doctor to locate the source of the cancer or polyp (even very early stage) and treat it by removing it. ------------------------- Follow instructions to collect sample, you may call the company for any help or questions, 24/7 telephone support at [phone].     Please schedule a Follow-up Appointment to: Return in about 6 months (around 06/18/2023) for 6 month follow-up PreDM A1c, HTN updates Thyroid.  If you have any other questions or concerns, please feel free to call the office or send a message through [place]. You may also schedule an earlier appointment if necessary.  Additionally, you may be receiving a survey about your experience at our office within a few days to 1 week by e-mail or mail. We value your feedback.  Nobie Putnam, DO [place]

## 2022-12-25 ENCOUNTER — Ambulatory Visit
Admission: RE | Admit: 2022-12-25 | Discharge: 2022-12-25 | Disposition: A | Discharge status: Home / Self Care | Payer: 59 | Source: Ambulatory Visit | Attending: Nurse Practitioner | Admitting: Nurse Practitioner

## 2022-12-25 DIAGNOSIS — Z1231 Encounter for screening mammogram for malignant neoplasm of breast: Secondary | ICD-10-CM | POA: Insufficient documentation

## 2023-01-15 ENCOUNTER — Ambulatory Visit: Payer: Managed Care, Other (non HMO) | Admitting: Family Medicine

## 2023-03-06 ENCOUNTER — Ambulatory Visit
Admission: RE | Admit: 2023-03-06 | Discharge: 2023-03-06 | Disposition: A | Discharge status: Home / Self Care | Payer: 59 | Attending: Family Medicine | Admitting: Family Medicine

## 2023-03-06 ENCOUNTER — Ambulatory Visit
Admission: RE | Admit: 2023-03-06 | Discharge: 2023-03-06 | Disposition: A | Discharge status: Home / Self Care | Payer: 59 | Source: Ambulatory Visit | Attending: Family Medicine | Admitting: Family Medicine

## 2023-03-06 ENCOUNTER — Encounter: Payer: Self-pay | Admitting: Family Medicine

## 2023-03-06 ENCOUNTER — Ambulatory Visit (INDEPENDENT_AMBULATORY_CARE_PROVIDER_SITE_OTHER): Payer: 59 | Admitting: Family Medicine

## 2023-03-06 VITALS — BP 145/86 | HR 110 | Wt 222.0 lb

## 2023-03-06 DIAGNOSIS — G8929 Other chronic pain: Secondary | ICD-10-CM | POA: Insufficient documentation

## 2023-03-06 DIAGNOSIS — I10 Essential (primary) hypertension: Secondary | ICD-10-CM | POA: Diagnosis not present

## 2023-03-06 DIAGNOSIS — M545 Low back pain, unspecified: Secondary | ICD-10-CM | POA: Diagnosis not present

## 2023-03-06 MED ORDER — MELOXICAM 15 MG PO TABS
15.0000 mg | ORAL_TABLET | Freq: Every day | ORAL | 0 refills | Status: DC | PRN
Start: 2023-03-06 — End: 2023-10-01

## 2023-03-06 MED ORDER — METHOCARBAMOL 500 MG PO TABS
500.0000 mg | ORAL_TABLET | Freq: Three times a day (TID) | ORAL | 0 refills | Status: DC | PRN
Start: 2023-03-06 — End: 2023-10-01

## 2023-03-06 NOTE — Patient Instructions (Addendum)
Thank you for coming to the office today.  X-ray today, stay tuned for results on MyChart.  Once reviewed, if there is significant arthritis or spinal degeneration then we can refer to an Orthopedic for consultation. They may consider PT physical therapy or rehab treatments, future maybe injections.  [facility] ([facility] Address: [address] [place], [place], [place] [address] Hours:  9AM-5PM Phone: [phone]  [facility] ORTHOPEDICS & [facility] MEDICINE [facility] [address] [place], [place]  [address] Phone: [phone]  Let me know which you prefer.  --------------------  In meantime, try the Meloxicam  daily anti inflammatory (this is rx strength ibuprofen / aleve), so one a day and done. Can take daily for 1 week to see if it works. Would only take it a week on and a week off.  Stop Baclofen if not effective Switch to alternative with Methocarbamol muscle relaxant, can take up to 3 times per day if needed, or just once or twice as needed.  Recommend to start taking Tylenol Extra Strength  tabs - take 1 to 2 tabs per dose (max ) every 6-8 hours for pain (take regularly, don't skip a dose for next 7 days), max 24 hour daily dose is 6 tablets or . In the future you can repeat the same everyday Tylenol course for 1-2 weeks at a time.    Please schedule a Follow-up Appointment to: Return in about 4 weeks (around 04/03/2023), or if symptoms worsen or fail to improve, for 4 weeks as needed back pain.  If you have any other questions or concerns, please feel free to call the office or send a message through MyChart. You may also schedule an earlier appointment if necessary.  Additionally, you may be receiving a survey about your experience at our office within a few days to 1 week by e-mail or mail. We value your feedback.  Saralyn Pilar, DO [facility], [facility]             Low Back Pain  Exercises  See other page with pictures of each exercise.  Start with 1 or 2 of these exercises that you are most comfortable with. Do not do any exercises that cause you significant worsening pain. Some of these may cause some "stretching soreness" but it should go away after you stop the exercise, and get better over time. Gradually increase up to 3-4 exercises as tolerated.  Standing hamstring stretch: Place the heel of your leg on a stool about 15 inches high. Keep your knee straight. Lean forward, bending at the hips until you feel a mild stretch in the back of your thigh. Make sure you do not roll your shoulders and bend at the waist when doing this or you will stretch your lower back instead. Hold the stretch for 15 to 30 seconds. Repeat 3 times. Repeat the same stretch on your other leg.  Cat and camel: Get down on your hands and knees. Let your stomach sag, allowing your back to curve downward. Hold this position for 5 seconds. Then arch your back and hold for 5 seconds. Do 3 sets of 10.  Quadriped Arm/Leg Raises: Get down on your hands and knees. Tighten your abdominal muscles to stiffen your spine. While keeping your abdominals tight, raise one arm and the opposite leg away from you. Hold this position for 5 seconds. Lower your arm and leg slowly and alternate sides. Do this 10 times on each side.  Pelvic tilt: Lorenz Coaster  on your back with your knees bent and your feet flat on the floor. Tighten your abdominal muscles and push your lower back into the floor. Hold this position for 5 seconds, then relax. Do 3 sets of 10.  Partial curl: Lie on your back with your knees bent and your feet flat on the floor. Tighten your stomach muscles and flatten your back against the floor. Tuck your chin to your chest. With your hands stretched out in front of you, curl your upper body forward until your shoulders clear the floor. Hold this position for 3 seconds. Don't hold your breath. It helps to breathe out as  you lift your shoulders up. Relax. Repeat 10 times. Build to 3 sets of 10. To challenge yourself, clasp your hands behind your head and keep your elbows out to the side.  Lower trunk rotation: Lie on your back with your knees bent and your feet flat on the floor. Tighten your abdominal muscles and push your lower back into the floor. Keeping your shoulders down flat, gently rotate your legs to one side, then the other as far as you can. Repeat 10 to 20 times.  Single knee to chest stretch: Lie on your back with your legs straight out in front of you. Bring one knee up to your chest and grasp the back of your thigh. Pull your knee toward your chest, stretching your buttock muscle. Hold this position for 15 to 30 seconds and return to the starting position. Repeat 3 times on each side.  Double knee to chest: Lie on your back with your knees bent and your feet flat on the floor. Tighten your abdominal muscles and push your lower back into the floor. Pull both knees up to your chest. Hold for 5 seconds and repeat 10 to 20 times.

## 2023-03-06 NOTE — Assessment & Plan Note (Signed)
Mild elevated BP Likely due to back pain No change to therapy Keep HCTZ  daily Future if not controlled consider other options

## 2023-03-06 NOTE — Progress Notes (Signed)
Subjective:    Patient ID: Alexis Moody, female    DOB: [birth year], 47 y.o.   MRN: [id number]  Alexis Moody is a 47 y.o. female presenting on 03/06/2023 for Back Pain (Patient states that her lower back has been hurting for over 6 months.tightening feeling when standing or walking. )   HPI  CHRONIC HTN: Reports now improved since treated Hyperthyroidism, controlled on HCTZ But has had some mild elevated BP recently with back issues Current Meds - Hydrochlorothiazide  daily - OFF Propranolol  TWICE A DAY for thyroid due to side effect intolerance Reports good compliance, took meds today. Tolerating well, w/o complaints. Lifestyle: - Diet: Goal to limit sodium but admits not changing her diet yet. Goal to try juicing in future. - Exercise: 2 x week treadmill - less active now due to back pain Admits bilateral ankle swelling, episodic Denies CP, dyspnea, HA, edema, dizziness / lightheadedness  Chronic, Bilateral Low Back Pain without sciatica  Onset 6 months ago No initial inciting injury  Last discussion 12/18/22 she had similar low back problem with spasms and issues. Seems gradually worsened and then has now not improved. No longer worsening. Describes pain as severe 9 out of 10 pain if prolonged standing.Feels like a tightening throbbing pain. Episodic pulsing in her back. Not radiating anywhere, no shooting pain down legs. - It can start within 5 minutes of standing up, improved if leaning forward or leaning on something or improved if sitting.  Tried Aleve OTC  x 2 without relief Haven't tried Tylenol Previous visit with me she was tried on Baclofen without relief. Did not improve symptoms Not on other rx medication No prior imaging X-ray Has not done PT No prior history Arthritis No problem sleeping due to back pain - Denies any fevers/chills, numbness, tingling, weakness, loss of control bladder/bowel incontinence or retention, unintentional wt loss, night  sweats       03/06/2023    4:01 PM 09/19/2022   10:04 AM 09/20/2020    9:28 AM  Depression screen PHQ 2/9  Decreased Interest 1 3 0  Down, Depressed, Hopeless 0 0 0  PHQ - 2 Score 1 3 0  Altered sleeping 0 1   Tired, decreased energy 1 3   Change in appetite 1 0   Feeling bad or failure about yourself  0 0   Trouble concentrating 0 0   Moving slowly or fidgety/restless 0 0   Suicidal thoughts 0 0   PHQ-9 Score 3 7   Difficult doing work/chores Not difficult at all Not difficult at all     Social History   Tobacco Use   Smoking status: Former    Types: Cigarettes    Start date: 11/13/1994    Quit date: 11/14/1999    Years since quitting: 23.3   Smokeless tobacco: Never  Vaping Use   Vaping Use: Former  Substance Use Topics   Alcohol use: Yes    Alcohol/week: 1.0 standard drink of alcohol    Types: 1 Standard drinks or equivalent per week    Comment: occasional   Drug use: No    Review of Systems Per HPI unless specifically indicated above     Objective:    BP (!) 145/86 (BP Location: Left Arm, Patient Position: Sitting)   Pulse (!) 110   Wt 222 lb (100.7 kg)   LMP 03/03/2023 (Exact Date)   SpO2 94%   BMI 43.36 kg/m   Wt Readings from Last 3 Encounters:  03/06/23 222 lb (100.7 kg)  12/18/22 211 lb (95.7 kg)  09/19/22 202 lb (91.6 kg)    Physical Exam Vitals and nursing note reviewed.  Constitutional:      General: She is not in acute distress.    Appearance: Normal appearance. She is well-developed. She is obese. She is not diaphoretic.     Comments: Well-appearing, comfortable, cooperative  HENT:     Head: Normocephalic and atraumatic.  Eyes:     General:        Right eye: No discharge.        Left eye: No discharge.     Conjunctiva/sclera: Conjunctivae normal.  Cardiovascular:     Rate and Rhythm: Normal rate.  Pulmonary:     Effort: Pulmonary effort is normal.  Musculoskeletal:     Comments: Low Back Inspection: Normal appearance, Large body  habitus, no spinal deformity, symmetrical. Palpation: No tenderness over spinous processes. Bilateral lumbar paraspinal muscles non-tender and without hypertonicity/spasm. - She has increased lumbar lordosis on exam  ROM: Full active ROM forward flex / back extension, rotation L/R without discomfort Special Testing: Seated SLR negative for radicular pain bilaterally  Strength: Bilateral hip flex/ext 5/5, knee flex/ext 5/5, ankle dorsiflex/plantarflex 5/5 Neurovascular: intact distal sensation to light touch   Skin:    General: Skin is warm and dry.     Findings: No erythema or rash.  Neurological:     Mental Status: She is alert and oriented to person, place, and time.  Psychiatric:        Mood and Affect: Mood normal.        Behavior: Behavior normal.        Thought Content: Thought content normal.     Comments: Well groomed, good eye contact, normal speech and thoughts    Results for orders placed or performed in visit on 12/18/22  HM PAP SMEAR  Result Value Ref Range   HM Pap smear Negative NILM, Negative high risk HPV       Assessment & Plan:   Problem List Items Addressed This Visit     Essential hypertension    Mild elevated BP Likely due to back pain No change to therapy Keep HCTZ 25mg  daily Future if not controlled consider other options      Morbid obesity   Other Visit Diagnoses     Chronic bilateral low back pain without sciatica    -  Primary   Relevant Medications   meloxicam (MOBIC) 15 MG tablet   methocarbamol (ROBAXIN) 500 MG tablet   Other Relevant Orders   DG Lumbar Spine Complete      Subacute worsening on chronic >6 month bilateral LBP without associated sciatica. Suspect likely due to muscle spasm/strain, without known injury or trauma.  Concern based on history, feels better leaning forward suggests some spinal stenosis possible, but limited data until we can pursue imaging  No prior known OA/DDD No prior surgery or injection No red flag  symptoms. Negative SLR for radiculopathy Inadequate conservative therapy and not responding to initial therapy Failed Baclofen  Plan: 1. Start anti-inflammatory trial with rx Meloxicam 15mg  daily interval 1-2 weeks AS NEEDED 2. Switch muscle relaxant to Methocarbamol 500mg  THREE TIMES A DAY AS NEEDED caution sedation 3. Trial Tylenol Ext Str 500mg  x 2 = 1000mg  THREE TIMES A DAY regular dosing  4. Encouraged use of heating pad 1-2x daily for now then PRN 5. Lumbar Cathlean Sauer today, pending review - consider referral to PT vs Orthopedics given 6  month duration impacting her function but sitting is not affecting her, she is able to work.    Orders Placed This Encounter  Procedures   DG Lumbar Spine Complete    Standing Status:   Future    Number of Occurrences:   1    Standing Expiration Date:   03/05/2024    Order Specific Question:   Reason for Exam (SYMPTOM  OR DIAGNOSIS REQUIRED)    Answer:   chronic >6 month low back bilateral pain, no prior imaging, history suggest spinal stenosis    Order Specific Question:   Is patient pregnant?    Answer:   No    Order Specific Question:   Preferred imaging location?    Answer:   [facility]     Meds ordered this encounter  Medications   meloxicam (MOBIC) 15 MG tablet    Sig: Take 1 tablet (15 mg total) by mouth daily as needed for pain.    Dispense:  30 tablet    Refill:  0   methocarbamol (ROBAXIN) 500 MG tablet    Sig: Take 1 tablet (500 mg total) by mouth every 8 (eight) hours as needed for muscle spasms.    Dispense:  30 tablet    Refill:  0      Follow up plan: Return in about 4 weeks (around 04/03/2023), or if symptoms worsen or fail to improve, for 4 weeks as needed back pain.   Saralyn Pilar, DO [facility] 03/06/2023, 4:01 PM

## 2023-03-14 ENCOUNTER — Ambulatory Visit: Payer: Self-pay | Admitting: *Deleted

## 2023-03-14 ENCOUNTER — Other Ambulatory Visit: Payer: Self-pay | Admitting: Internal Medicine

## 2023-03-14 ENCOUNTER — Telehealth: Payer: Self-pay

## 2023-03-14 DIAGNOSIS — M545 Low back pain, unspecified: Secondary | ICD-10-CM

## 2023-03-14 NOTE — Telephone Encounter (Signed)
Patient request handicap form be filled out.

## 2023-03-14 NOTE — Telephone Encounter (Signed)
  Chief Complaint: Leaning towards PT for her back but will call back or respond via MyChart with her answer.    I read her the x ray report from Dr. Althea Charon dated 03/03/2023 at 6:28 PM  Pt is also wanting to apply for a handicapped sticker due to it hurts to walk a long way with her back.  Symptoms: N/A Frequency: N/A Pertinent Negatives: Patient denies N/A Disposition: [] ED /[] Urgent Care (no appt availability in office) / [] Appointment(In office/virtual)/ []  [place] [facility]/ [] Home Care/ [] Refused Recommended Disposition /[] [place] [facility]/ [x]  Follow-up with PCP Additional Notes: Response sent to Dr. Althea Charon.   She will let us know if she wants to do the PT or see ortho for her back.  Leaning towards doing PT.   She will call back or respond via MyChart.    Also inquiring about a Handicapped sticker due to her backpain.

## 2023-03-14 NOTE — Telephone Encounter (Signed)
Pt called in regarding her x ray results of her spine.  I read her the message from Dr. Althea Charon dated 03/13/2023 at 6:28 PM.  She is leaning towards PT but wants to think about and will call us back or send a message via MyChart. Reason for Disposition  [1] Follow-up call to recent contact AND [2] information only call, no triage required  Answer Assessment - Initial Assessment Questions 1. REASON FOR CALL or QUESTION: "What is your reason for calling today?" or "How can I best help you?" or "What question do you have that I can help answer?"     Read her the x ray report from Dr. Althea Charon.  Protocols used: Information Only Call - No Triage-A-AH

## 2023-03-15 NOTE — Telephone Encounter (Signed)
Patient informed that handicap form is ready for pick up

## 2023-03-15 NOTE — Telephone Encounter (Signed)
Handicap placard ready for pickup. Rene Kocher has already placed ortho referral 03/14/23  Saralyn Pilar, DO [facility] 03/15/2023, 12:27 PM

## 2023-06-18 ENCOUNTER — Ambulatory Visit: Payer: 59 | Admitting: Family Medicine

## 2023-07-02 ENCOUNTER — Ambulatory Visit: Payer: 59 | Admitting: Family Medicine

## 2023-09-30 ENCOUNTER — Other Ambulatory Visit: Payer: Self-pay | Admitting: Family Medicine

## 2023-09-30 DIAGNOSIS — R600 Localized edema: Secondary | ICD-10-CM

## 2023-09-30 DIAGNOSIS — I10 Essential (primary) hypertension: Secondary | ICD-10-CM

## 2023-10-01 ENCOUNTER — Ambulatory Visit (INDEPENDENT_AMBULATORY_CARE_PROVIDER_SITE_OTHER): Payer: 59 | Admitting: Family Medicine

## 2023-10-01 ENCOUNTER — Encounter: Payer: Self-pay | Admitting: Family Medicine

## 2023-10-01 VITALS — BP 150/80 | Ht 60.0 in | Wt 223.0 lb

## 2023-10-01 DIAGNOSIS — Z114 Encounter for screening for human immunodeficiency virus [HIV]: Secondary | ICD-10-CM

## 2023-10-01 DIAGNOSIS — E559 Vitamin D deficiency, unspecified: Secondary | ICD-10-CM

## 2023-10-01 DIAGNOSIS — E538 Deficiency of other specified B group vitamins: Secondary | ICD-10-CM

## 2023-10-01 DIAGNOSIS — I10 Essential (primary) hypertension: Secondary | ICD-10-CM

## 2023-10-01 DIAGNOSIS — Z5320 Procedure and treatment not carried out because of patient's decision for unspecified reasons: Secondary | ICD-10-CM

## 2023-10-01 DIAGNOSIS — G8929 Other chronic pain: Secondary | ICD-10-CM

## 2023-10-01 DIAGNOSIS — E0590 Thyrotoxicosis, unspecified without thyrotoxic crisis or storm: Secondary | ICD-10-CM

## 2023-10-01 DIAGNOSIS — M545 Low back pain, unspecified: Secondary | ICD-10-CM | POA: Diagnosis not present

## 2023-10-01 DIAGNOSIS — Z1159 Encounter for screening for other viral diseases: Secondary | ICD-10-CM

## 2023-10-01 DIAGNOSIS — R7303 Prediabetes: Secondary | ICD-10-CM

## 2023-10-01 NOTE — Telephone Encounter (Signed)
Requested Prescriptions  Pending Prescriptions Disp Refills   hydrochlorothiazide (HYDRODIURIL) 25 MG tablet [Pharmacy Med Name: HYDROCHLOROTHIAZIDE 25MG  TABLETS] 90 tablet 1    Sig: TAKE 1 TABLET(25MG ) BY MOUTH DAILY     Cardiovascular: Diuretics - Thiazide Failed - 09/30/2023  9:28 PM      Failed - Cr in normal range and within 180 days    Creatinine, Ser  Date Value Ref Range Status  12/13/2022 0.94 0.57 - 1.00 mg/dL Final         Failed - K in normal range and within 180 days    Potassium  Date Value Ref Range Status  12/13/2022 4.1 3.5 - 5.2 mmol/L Final         Failed - Na in normal range and within 180 days    Sodium  Date Value Ref Range Status  12/13/2022 140 134 - 144 mmol/L Final         Failed - Last BP in normal range    BP Readings from Last 1 Encounters:  10/01/23 (!) 150/80         Failed - Valid encounter within last 6 months    Recent Outpatient Visits           Today Essential hypertension   [place] [facility] Smitty Cords, DO   6 months ago Chronic bilateral low back pain without sciatica   [facility] Smitty Cords, DO   9 months ago Annual physical exam   [facility] Smitty Cords, DO   1 year ago Essential hypertension   [place] [facility] [place], Netta Neat, DO   3 years ago Hypertension goal BP (blood pressure) < 140/90   [facility] Danelle Berry, [place]       Future Appointments             In 3 months Althea Charon, Netta Neat, DO [facility], [facility]

## 2023-10-01 NOTE — Assessment & Plan Note (Signed)
Due for A1c on upcoming lab

## 2023-10-01 NOTE — Assessment & Plan Note (Signed)
Mild elevated BP again, did not take med today, since 3rd shift Likely due to back pain No change to therapy Keep HCTZ 25mg  daily Monitor BP outside office, bring report to next visit Future if not controlled consider other options - 2nd agent, ARB, CCB

## 2023-10-01 NOTE — Assessment & Plan Note (Signed)
Followed by [facility] Dr Tedd Sias On methimazole Repeat labs today

## 2023-10-01 NOTE — Patient Instructions (Addendum)
Thank you for coming to the office today.  Cologuard - don't forget to send this back!  Goal to keep track of BP regularly, few times a week until next apt.  Goal < 140 / 90.  Today BP still elevated, but suspect that is due to not having the medicine yet in system.  If you get consistently elevated BP readings even on the medicine, let me know we can add a 2nd medication for you before your apt.  [facility] orders today, complete them about 1 week before your next apt in February 2025.  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  Okay to use heating pad as needed.  Recommend to start taking Tylenol Extra Strength 500mg  tabs - take 1 to 2 tabs per dose (max 1000mg ) every 6-8 hours for pain (take regularly, don't skip a dose for next 7 days), max 24 hour daily dose is 6 tablets or 3000mg   We can reconsider other rx medications muscle relaxant or anti inflammatory if need.  DUE for FASTING BLOOD WORK (no food or drink after midnight before the lab appointment, only water or coffee without cream/sugar on the morning of)  For Lab Results, once available within 2-3 days of blood draw, you can can log in to MyChart online to view your results and a brief explanation. Also, we can discuss results at next follow-up visit.   Please schedule a Follow-up Appointment to: Return in about 3 months (around 01/01/2024) for 3 month Annual Physical (early AM, Monday only), print AVS.  If you have any other questions or concerns, please feel free to call the office or send a message through MyChart. You may also schedule an earlier appointment if necessary.  Additionally, you may be receiving a survey about your experience at our office within a few days to 1 week by e-mail or mail. We value your feedback.  Saralyn Pilar, DO [facility], [place]

## 2023-10-01 NOTE — Assessment & Plan Note (Signed)
Encourage lifestyle intervention for weight We can review other options going forward if interested in weight management

## 2023-10-01 NOTE — Progress Notes (Signed)
Subjective:    Patient ID: Alexis Moody, female    DOB: Sep 20, 1976, 47 y.o.   MRN: [id number]  Alexis Moody is a 47 y.o. female presenting on 10/01/2023 for Back Pain (Ongoing problem since summer, unsure of injury), Weight Check, and Hypertension   HPI  Discussed the use of AI scribe software for clinical note transcription with the patient, who gave verbal consent to proceed.  The patient, a Engineer, production, presented for a routine follow-up visit with concerns about her blood pressure and lower back pain. The patient's last full panel of blood work was conducted in January, revealing borderline prediabetes and mild cholesterol levels. The patient is due for another full panel in the upcoming January, but expressed interest in having her blood type determined.  Low Back pain / Arthritis The patient also reported persistent lower back pain, which had been previously evaluated by [facility]. An x-ray revealed mild lumbar spine osteoarthritis, which is likely the cause of the patient's symptoms. The patient underwent physical therapy, which initially improved her symptoms, but the pain has since returned. The patient noted that the pain increases with standing or walking and improves with sitting. Last imaging X-ray here 02/2023 showed degenerative arthritis changes. Taking Tylenol, asks about dose and other meds    Elevated A1c / Pre Diabetes Obesity, Morbid BMI >43 Due for A1c, prior result A1c 5.8   Hyperthyroidism Followed by Dr Tedd Sias [facility]) - Currently improving levels of thyroid now, managed on Methimozole 5mg  daily   Low back symptoms flared up at times with back spasms of muscle    CHRONIC HTN: Still elevated BP today off of med. Did not take today due to appointment It had improved on med Current Meds - Hydrochlorothiazide 25mg  daily - OFF Propranolol 80mg  TWICE A DAY for thyroid due to side effect intolerance Lifestyle: - Diet: Goal to limit sodium but  admits not changing her diet yet. Goal to try juicing in future. - Exercise: 2 x week treadmill Admits bilateral ankle swelling, episodic Denies CP, dyspnea, HA, edema, dizziness / lightheadedness   PMH Achilles Tendonitis R Ankle pain     The patient also mentioned that she has a colon screening test at home that she needs to complete and return. She also inquired about renewing her handicap parking permit due to the difficulty she experiences when walking long distances, particularly after shopping.  Health Maintenance:   Followed by Shirlyn Goltz, last seen by Margaretmary Eddy CNM 03/27/22, but last pap smear collected was 09/28/20 - negative, negative HPV - abstract - She will repeat pap at next visit. It was not done earlier in 2024 at GYN visit.   Colon CA Screening - ordered Cologuard last visit - She has not completed Cologuard, has kit.     10/01/2023    8:47 AM 03/06/2023    4:01 PM 09/19/2022   10:04 AM  Depression screen PHQ 2/9  Decreased Interest 1 1 3   Down, Depressed, Hopeless 0 0 0  PHQ - 2 Score 1 1 3   Altered sleeping 1 0 1  Tired, decreased energy 2 1 3   Change in appetite 1 1 0  Feeling bad or failure about yourself  0 0 0  Trouble concentrating 0 0 0  Moving slowly or fidgety/restless 0 0 0  Suicidal thoughts 0 0 0  PHQ-9 Score 5 3 7   Difficult doing work/chores Somewhat difficult Not difficult at all Not difficult at all  10/01/2023    8:47 AM 03/06/2023    4:02 PM 09/19/2022   10:05 AM  GAD 7 : Generalized Anxiety Score  Nervous, Anxious, on Edge 0 0 0  Control/stop worrying 0 0 0  Worry too much - different things 0 0 0  Trouble relaxing 0 0 0  Restless 0 0 0  Easily annoyed or irritable 0 0 0  Afraid - awful might happen 0 0 0  Total GAD 7 Score 0 0 0  Anxiety Difficulty  Not difficult at all Not difficult at all    Social History   Tobacco Use   Smoking status: Former    Current packs/day: 0.00    Types: Cigarettes    Start date:  11/13/1994    Quit date: 11/14/1999    Years since quitting: 23.8   Smokeless tobacco: Never  Vaping Use   Vaping status: Former  Substance Use Topics   Alcohol use: Yes    Alcohol/week: 1.0 standard drink of alcohol    Types: 1 Standard drinks or equivalent per week    Comment: occasional   Drug use: No    Review of Systems Per HPI unless specifically indicated above     Objective:    BP (!) 150/80 (BP Location: Left Arm, Cuff Size: Large)   Ht 5' (1.524 m)   Wt 223 lb (101.2 kg)   BMI 43.55 kg/m   Wt Readings from Last 3 Encounters:  10/01/23 223 lb (101.2 kg)  03/06/23 222 lb (100.7 kg)  12/18/22 211 lb (95.7 kg)    Physical Exam Vitals and nursing note reviewed.  Constitutional:      General: She is not in acute distress.    Appearance: She is well-developed. She is obese. She is not diaphoretic.     Comments: Well-appearing, comfortable, cooperative  HENT:     Head: Normocephalic and atraumatic.  Eyes:     General:        Right eye: No discharge.        Left eye: No discharge.     Conjunctiva/sclera: Conjunctivae normal.  Neck:     Thyroid: No thyromegaly.  Cardiovascular:     Rate and Rhythm: Normal rate and regular rhythm.     Heart sounds: Normal heart sounds. No murmur heard. Pulmonary:     Effort: Pulmonary effort is normal. No respiratory distress.     Breath sounds: Normal breath sounds. No wheezing or rales.  Musculoskeletal:        General: Normal range of motion.     Cervical back: Normal range of motion and neck supple.     Right lower leg: No edema.     Left lower leg: No edema.  Lymphadenopathy:     Cervical: No cervical adenopathy.  Skin:    General: Skin is warm and dry.     Findings: No erythema or rash.  Neurological:     Mental Status: She is alert and oriented to person, place, and time.  Psychiatric:        Behavior: Behavior normal.     Comments: Well groomed, good eye contact, normal speech and thoughts     I have personally  reviewed the radiology report from 03/10/23 on Lumbar Spine.  CLINICAL DATA:  chronic >6 month low back bilateral pain, no prior imaging, history suggest spinal stenosis   EXAM: LUMBAR SPINE - COMPLETE 4+ VIEW   COMPARISON:  None Available.   FINDINGS: There are five non-rib bearing lumbar-type vertebral bodies.  There is normal alignment. There is no evidence for acute fracture or subluxation. Intervertebral disc spaces are preserved without significant degenerative changes. Mild lower lumbar facet arthropathy. Visualized abdomen is unremarkable.   IMPRESSION: Mild lower lumbar facet arthropathy.     Electronically Signed   By: Meda Klinefelter M.D.   On: 03/10/2023 14:04  Results for orders placed or performed in visit on 12/18/22  HM PAP SMEAR  Result Value Ref Range   HM Pap smear Negative NILM, Negative high risk HPV       Assessment & Plan:   Problem List Items Addressed This Visit     Pre-diabetes (Chronic)   Relevant Orders   Hemoglobin A1c   Essential hypertension - Primary   Relevant Orders   Lipid panel   CBC with Differential/Platelet   Comprehensive metabolic panel   Hyperthyroidism   Relevant Orders   TSH   Morbid obesity (HCC)   Relevant Orders   Hemoglobin A1c   Lipid panel   CBC with Differential/Platelet   Other Visit Diagnoses     Chronic bilateral low back pain without sciatica       Vitamin B12 nutritional deficiency       Relevant Orders   Vitamin B12   Vitamin D deficiency       Relevant Orders   VITAMIN D 25 Hydroxy (Vit-D Deficiency, Fractures)   Screening for hepatitis C declined       Need for hepatitis C screening test       Relevant Orders   Hepatitis C antibody   Screening for HIV (human immunodeficiency virus)       Relevant Orders   HIV Antibody (routine testing w rflx)        Lumbar Spinal Degenerative Arthritis Mild arthritis noted on imaging, causing intermittent back pain. Discussed various treatment options  including Tylenol and muscle relaxants. - Increase Tylenol to 1000mg  as needed for pain. - Consider other prescription medications if Tylenol is not effective.  General Health Maintenance - Declined flu shot. - Complete colon screening cologuardkit at home. - Pap smear due next year per GYN        Orders Placed This Encounter  Procedures   Hemoglobin A1c   Lipid panel    Order Specific Question:   Has the patient fasted?    Answer:   Yes   CBC with Differential/Platelet   TSH   HIV Antibody (routine testing w rflx)   Hepatitis C antibody   VITAMIN D 25 Hydroxy (Vit-D Deficiency, Fractures)   Vitamin B12   Comprehensive metabolic panel    Order Specific Question:   Has the patient fasted?    Answer:   Yes    No orders of the defined types were placed in this encounter.   Follow up plan: Return in about 3 months (around 01/01/2024) for 3 month Annual Physical (early AM, Monday only), print AVS.   [facility] orders today - CMET Lipid A1c TSH CBC HIV Hep C   Saralyn Pilar, DO [facility] 10/01/2023, 8:57 AM

## 2023-12-25 LAB — HIV ANTIBODY (ROUTINE TESTING W REFLEX): HIV Screen 4th Generation wRfx: NONREACTIVE

## 2023-12-25 LAB — COMPREHENSIVE METABOLIC PANEL
ALT: 12 [IU]/L (ref 0–32)
AST: 9 [IU]/L (ref 0–40)
Albumin: 4 g/dL (ref 3.9–4.9)
Alkaline Phosphatase: 70 [IU]/L (ref 44–121)
BUN/Creatinine Ratio: 13 (ref 9–23)
BUN: 14 mg/dL (ref 6–24)
Bilirubin Total: 0.3 mg/dL (ref 0.0–1.2)
CO2: 27 mmol/L (ref 20–29)
Calcium: 9.3 mg/dL (ref 8.7–10.2)
Chloride: 103 mmol/L (ref 96–106)
Creatinine, Ser: 1.07 mg/dL — ABNORMAL HIGH (ref 0.57–1.00)
Globulin, Total: 2.5 g/dL (ref 1.5–4.5)
Glucose: 102 mg/dL — ABNORMAL HIGH (ref 70–99)
Potassium: 3.7 mmol/L (ref 3.5–5.2)
Sodium: 143 mmol/L (ref 134–144)
Total Protein: 6.5 g/dL (ref 6.0–8.5)
eGFR: 65 mL/min/{1.73_m2} (ref >59)

## 2023-12-25 LAB — CBC WITH DIFFERENTIAL/PLATELET
Basophils Absolute: 0.1 10*3/uL (ref 0.0–0.2)
Basos: 1 %
EOS (ABSOLUTE): 0.3 10*3/uL (ref 0.0–0.4)
Eos: 4 %
Hematocrit: 36 % (ref 34.0–46.6)
Hemoglobin: 11.4 g/dL (ref 11.1–15.9)
Immature Grans (Abs): 0 10*3/uL (ref 0.0–0.1)
Immature Granulocytes: 0 %
Lymphocytes Absolute: 2.7 10*3/uL (ref 0.7–3.1)
Lymphs: 36 %
MCH: 24.9 pg — ABNORMAL LOW (ref 26.6–33.0)
MCHC: 31.7 g/dL (ref 31.5–35.7)
MCV: 79 fL (ref 79–97)
Monocytes Absolute: 0.6 10*3/uL (ref 0.1–0.9)
Monocytes: 8 %
Neutrophils Absolute: 3.9 10*3/uL (ref 1.4–7.0)
Neutrophils: 51 %
Platelets: 220 10*3/uL (ref 150–450)
RBC: 4.57 x10E6/uL (ref 3.77–5.28)
RDW: 16.2 % — ABNORMAL HIGH (ref 11.7–15.4)
WBC: 7.6 10*3/uL (ref 3.4–10.8)

## 2023-12-25 LAB — HEMOGLOBIN A1C
Est. average glucose Bld gHb Est-mCnc: 117 mg/dL
Hgb A1c MFr Bld: 5.7 % — ABNORMAL HIGH (ref 4.8–5.6)

## 2023-12-25 LAB — VITAMIN B12: Vitamin B-12: 398 pg/mL (ref 232–1245)

## 2023-12-25 LAB — VITAMIN D 25 HYDROXY (VIT D DEFICIENCY, FRACTURES): Vit D, 25-Hydroxy: 35.8 ng/mL (ref 30.0–100.0)

## 2023-12-25 LAB — HEPATITIS C ANTIBODY: Hep C Virus Ab: NONREACTIVE

## 2023-12-25 LAB — LIPID PANEL
Chol/HDL Ratio: 3.5 {ratio} (ref 0.0–4.4)
Cholesterol, Total: 201 mg/dL — ABNORMAL HIGH (ref 100–199)
HDL: 57 mg/dL (ref >39)
LDL Chol Calc (NIH): 129 mg/dL — ABNORMAL HIGH (ref 0–99)
Triglycerides: 82 mg/dL (ref 0–149)
VLDL Cholesterol Cal: 15 mg/dL (ref 5–40)

## 2023-12-25 LAB — TSH: TSH: 1.34 u[IU]/mL (ref 0.450–4.500)

## 2023-12-31 ENCOUNTER — Ambulatory Visit: Payer: Managed Care, Other (non HMO) | Admitting: Family Medicine

## 2023-12-31 ENCOUNTER — Encounter: Payer: Self-pay | Admitting: Family Medicine

## 2023-12-31 VITALS — BP 130/82 | HR 83 | Ht 60.0 in | Wt 230.0 lb

## 2023-12-31 DIAGNOSIS — G8929 Other chronic pain: Secondary | ICD-10-CM

## 2023-12-31 DIAGNOSIS — I10 Essential (primary) hypertension: Secondary | ICD-10-CM

## 2023-12-31 DIAGNOSIS — R7303 Prediabetes: Secondary | ICD-10-CM | POA: Diagnosis not present

## 2023-12-31 DIAGNOSIS — E538 Deficiency of other specified B group vitamins: Secondary | ICD-10-CM

## 2023-12-31 DIAGNOSIS — Z1211 Encounter for screening for malignant neoplasm of colon: Secondary | ICD-10-CM | POA: Diagnosis not present

## 2023-12-31 DIAGNOSIS — E782 Mixed hyperlipidemia: Secondary | ICD-10-CM

## 2023-12-31 DIAGNOSIS — E0590 Thyrotoxicosis, unspecified without thyrotoxic crisis or storm: Secondary | ICD-10-CM

## 2023-12-31 DIAGNOSIS — E559 Vitamin D deficiency, unspecified: Secondary | ICD-10-CM

## 2023-12-31 DIAGNOSIS — Z0000 Encounter for general adult medical examination without abnormal findings: Secondary | ICD-10-CM

## 2023-12-31 DIAGNOSIS — M545 Low back pain, unspecified: Secondary | ICD-10-CM

## 2023-12-31 DIAGNOSIS — Z6841 Body Mass Index (BMI) 40.0 and over, adult: Secondary | ICD-10-CM

## 2023-12-31 NOTE — Patient Instructions (Addendum)
Thank you for coming to the office today.  Zepbound TO Check Cost & Coverage of Zepbound Please contact Research officer, trade union (manufacturer for [employer]) 1-800-LillyRx [phone]) - Live agent to discuss cost and coverage.   [email or web address] Verified [email or web address]     Contrave - oral medication, appetite suppression has wellbutrin/bupropion and naltrexone in it and it can also help with appetite, it is ordered through a speciality pharmacy. - $99 per month   Try 1 week sample Contrave - Week 1: 1 pill daily with meal - Week 2: 1 pill twice a day with meal - Week 3: 2 pill with meal in AM and 1 pill with PM meal - Week 4: 2 pill twice a day with meal   Please notify me after 1 week, and if doing well on Contrave, I can order to [facility] pharmacy $99 per month plan with this med.     Free sample 7 day, 1 pill per day for 1 week   Alternative options without insurance   Semaglutide injection (mixed Ozempic) from [facility] 0.25mg  weekly for 4 weeks then increase to 0.5mg  weekly It comes in a vial and a needle syringe, you need to draw up the shot and self admin it weekly Cost is about $200 per month Call them to check pricing and availability   [facility] Address: [address], [place], [place] [address] Phone: [phone]   -----------------   [email or web address]   [email or web address]   Materials engineer Team With insurance $79/mo + copay for medications Without insurance $79/mo + $99/mo to include medication (Semaglutide) Without insurance $79/mo + $199/mo to include medication (Tirzepatide) [email or web address] =[email or web address]   Colon Cancer Screening:  Ordered the Cologuard (home kit) test for colon cancer screening. Stay tuned for further updates.  It will be shipped to you directly. If not received in 2-4 weeks, call us or the  company.   If you send it back and no results are received in 2-4 weeks, call us or the company as well!   Colon Cancer Screening: - For all adults age 24+ routine colon cancer screening is highly recommended.     - Recent guidelines from American Cancer Society recommend starting age of 98 - Early detection of colon cancer is important, because often there are no warning signs or symptoms, also if found early usually it can be cured. Late stage is hard to treat.   - If Cologuard is NEGATIVE, then it is good for 3 years before next due - If Cologuard is POSITIVE, then it is strongly advised to get a Colonoscopy, which allows the GI doctor to locate the source of the cancer or polyp (even very early stage) and treat it by removing it. ------------------------- Follow instructions to collect sample, you may call the company for any help or questions, 24/7 telephone support at [phone].   Please schedule a Follow-up Appointment to: Return in about 3 months (around 03/29/2024).  If you have any other questions or concerns, please feel free to call the office or send a message through MyChart. You may also schedule an earlier appointment if necessary.  Additionally, you may be receiving a survey about your experience at our office within a few days to 1 week by e-mail or mail. We value your feedback.  Saralyn Pilar, DO [facility], [place]

## 2023-12-31 NOTE — Progress Notes (Signed)
Subjective:    Patient ID: Alexis Moody, female    DOB: [birth year], 48 y.o.   MRN: [id number]  Alexis Moody is a 48 y.o. female presenting on 12/31/2023 for Annual Exam   HPI  Discussed the use of AI scribe software for clinical note transcription with the patient, who gave verbal consent to proceed.  History of Present Illness   Alexis Moody is a 48 year old female who presents for an annual physical exam.     History of Anemia, Microcytic She has a history of anemia and is currently taking iron supplements twice a week. Her hemoglobin and hematocrit levels are within normal range, but she has experienced low iron levels in the past. She feels fatigued and attributes it to her weight and possibly low iron levels.  Mild elevated Cr 1.07, goal to drink more water   Low Back pain / Arthritis The patient also reported persistent lower back pain, which had been previously evaluated by [facility]. An x-ray revealed mild lumbar spine osteoarthritis, which is likely the cause of the patient's symptoms. The patient underwent physical therapy, which initially improved her symptoms, but the pain has since returned. The patient noted that the pain increases with standing or walking and improves with sitting. Last imaging X-ray here 02/2023 showed degenerative arthritis changes. Taking Tylenol, asks about dose and other meds    Elevated A1c / Pre Diabetes Obesity, Morbid BMI >44  A1c 5.7 (prior history 5.8 to 6.2)   Hyperthyroidism Followed by Dr Tedd Sias [facility]) - Currently improving levels of thyroid now, managed on Methimozole 5mg  daily   CHRONIC HTN: Home readings arm cuff, BP cuff 150/90  Office reading improved Current Meds - Hydrochlorothiazide 25mg  daily - OFF Propranolol 80mg  TWICE A DAY for thyroid due to side effect intolerance Lifestyle: - Diet: Goal to limit sodium but admits not changing her diet yet. Goal to try juicing in future. - Exercise: 2 x week  treadmill Admits bilateral ankle swelling, episodic Denies CP, dyspnea, HA, edema, dizziness / lightheadedness  She is concerned about her weight, which has increased by 8 pounds since April, now at 230 pounds. She has started a diet including baked chicken and vegetables and has begun juicing. She feels fatigued and attributes it to her weight.   She also inquired about renewing her handicap parking permit due to the difficulty she experiences when walking long distances, particularly after shopping.   Health Maintenance:   Followed by Shirlyn Goltz, last seen by Margaretmary Eddy CNM 03/27/22, but last pap smear collected was 09/28/20 - negative, negative HPV - abstract        12/31/2023    9:25 AM 10/01/2023    8:47 AM 03/06/2023    4:01 PM  Depression screen PHQ 2/9  Decreased Interest 2 1 1   Down, Depressed, Hopeless 0 0 0  PHQ - 2 Score 2 1 1   Altered sleeping 0 1 0  Tired, decreased energy 3 2 1   Change in appetite 2 1 1   Feeling bad or failure about yourself  0 0 0  Trouble concentrating 0 0 0  Moving slowly or fidgety/restless 0 0 0  Suicidal thoughts 0 0 0  PHQ-9 Score 7 5 3   Difficult doing work/chores Very difficult Somewhat difficult Not difficult at all       12/31/2023    9:25 AM 10/01/2023    8:47 AM 03/06/2023    4:02 PM 09/19/2022   10:05 AM  GAD  7 : Generalized Anxiety Score  Nervous, Anxious, on Edge 0 0 0 0  Control/stop worrying 0 0 0 0  Worry too much - different things 0 0 0 0  Trouble relaxing 0 0 0 0  Restless 0 0 0 0  Easily annoyed or irritable 0 0 0 0  Afraid - awful might happen 0 0 0 0  Total GAD 7 Score 0 0 0 0  Anxiety Difficulty   Not difficult at all Not difficult at all     Past Medical History:  Diagnosis Date   Hyperlipidemia    Hypertension    Obesity    Obesity, Class II, BMI 35-39.9, with comorbidity    Thyroid disease    Past Surgical History:  Procedure Laterality Date   REDUCTION MAMMAPLASTY     Social History    Socioeconomic History   Marital status: Married    Spouse name: Not on file   Number of children: Not on file   Years of education: Not on file   Highest education level: Not on file  Occupational History   Not on file  Tobacco Use   Smoking status: Former    Current packs/day: 0.00    Types: Cigarettes    Start date: 11/13/1994    Quit date: 11/14/1999    Years since quitting: 24.1   Smokeless tobacco: Never  Vaping Use   Vaping status: Former  Substance and Sexual Activity   Alcohol use: Yes    Alcohol/week: 1.0 standard drink of alcohol    Types: 1 Standard drinks or equivalent per week    Comment: occasional   Drug use: No   Sexual activity: Yes    Partners: Male    Birth control/protection: None  Other Topics Concern   Not on file  Social History Narrative   Not on file   Social Drivers of Health   Financial Resource Strain: Low Risk  (12/30/2023)   Overall Financial Resource Strain (CARDIA)    Difficulty of Paying Living Expenses: Not hard at all  Food Insecurity: No Food Insecurity (12/30/2023)   Hunger Vital Sign    Worried About Running Out of Food in the Last Year: Never true    Ran Out of Food in the Last Year: Never true  Transportation Needs: No Transportation Needs (12/30/2023)   PRAPARE - Administrator, Civil Service (Medical): No    Lack of Transportation (Non-Medical): No  Physical Activity: Inactive (12/30/2023)   Exercise Vital Sign    Days of Exercise per Week: 0 days    Minutes of Exercise per Session: 10 min  Stress: No Stress Concern Present (12/30/2023)   [employer] of Occupational Health - Occupational Stress Questionnaire    Feeling of Stress : Not at all  Social Connections: Unknown (12/30/2023)   Social Connection and Isolation Panel [NHANES]    Frequency of Communication with Friends and Family: More than three times a week    Frequency of Social Gatherings with Friends and Family: Once a week    Attends Religious  Services: Patient declined    Database administrator or Organizations: Patient declined    Attends Engineer, structural: Not on file    Marital Status: Married  Catering manager Violence: Not on file   Family History  Problem Relation Age of Onset   Hyperlipidemia Mother    Hypertension Mother    Breast cancer Mother 60   Hyperlipidemia Father    Hypertension Father  Diabetes Father    Hypertension Maternal Grandfather    Multiple sclerosis Maternal Grandmother    Leukemia Cousin    Stroke Neg Hx    Heart disease Neg Hx    Current Outpatient Medications on File Prior to Visit  Medication Sig   Cholecalciferol (VITAMIN D) 2000 UNITS CAPS Take by mouth daily.    ferrous sulfate 324 MG TBEC Take 324 mg by mouth.   hydrochlorothiazide (HYDRODIURIL) 25 MG tablet TAKE 1 TABLET(25MG ) BY MOUTH DAILY   methimazole (TAPAZOLE) 5 MG tablet Take by mouth.   No current facility-administered medications on file prior to visit.    Review of Systems  Constitutional:  Negative for activity change, appetite change, chills, diaphoresis, fatigue and fever.  HENT:  Negative for congestion and hearing loss.   Eyes:  Negative for visual disturbance.  Respiratory:  Negative for cough, chest tightness, shortness of breath and wheezing.   Cardiovascular:  Negative for chest pain, palpitations and leg swelling.  Gastrointestinal:  Negative for abdominal pain, constipation, diarrhea, nausea and vomiting.  Genitourinary:  Negative for dysuria, frequency and hematuria.  Musculoskeletal:  Negative for arthralgias and neck pain.  Skin:  Negative for rash.  Neurological:  Negative for dizziness, weakness, light-headedness, numbness and headaches.  Hematological:  Negative for adenopathy.  Psychiatric/Behavioral:  Negative for behavioral problems, dysphoric mood and sleep disturbance.    Per HPI unless specifically indicated above     Objective:    BP 130/82 (BP Location: Left Arm, Cuff  Size: Large)   Pulse 83   Ht 5' (1.524 m)   Wt 230 lb (104.3 kg)   SpO2 92%   BMI 44.92 kg/m   Wt Readings from Last 3 Encounters:  12/31/23 230 lb (104.3 kg)  10/01/23 223 lb (101.2 kg)  03/06/23 222 lb (100.7 kg)    Physical Exam Vitals and nursing note reviewed.  Constitutional:      General: She is not in acute distress.    Appearance: She is well-developed. She is obese. She is not diaphoretic.     Comments: Well-appearing, comfortable, cooperative  HENT:     Head: Normocephalic and atraumatic.  Eyes:     General:        Right eye: No discharge.        Left eye: No discharge.     Conjunctiva/sclera: Conjunctivae normal.     Pupils: Pupils are equal, round, and reactive to light.  Neck:     Thyroid: No thyromegaly.  Cardiovascular:     Rate and Rhythm: Normal rate and regular rhythm.     Pulses: Normal pulses.     Heart sounds: Normal heart sounds. No murmur heard. Pulmonary:     Effort: Pulmonary effort is normal. No respiratory distress.     Breath sounds: Normal breath sounds. No wheezing or rales.  Abdominal:     General: Bowel sounds are normal. There is no distension.     Palpations: Abdomen is soft. There is no mass.     Tenderness: There is no abdominal tenderness.  Musculoskeletal:        General: No tenderness. Normal range of motion.     Cervical back: Normal range of motion and neck supple.     Right lower leg: No edema.     Left lower leg: No edema.     Comments: Upper / Lower Extremities: - Normal muscle tone, strength bilateral upper extremities 5/5, lower extremities 5/5  Lymphadenopathy:     Cervical: No cervical adenopathy.  Skin:    General: Skin is warm and dry.     Findings: No erythema or rash.  Neurological:     Mental Status: She is alert and oriented to person, place, and time.     Comments: Distal sensation intact to light touch all extremities  Psychiatric:        Mood and Affect: Mood normal.        Behavior: Behavior normal.         Thought Content: Thought content normal.     Comments: Well groomed, good eye contact, normal speech and thoughts     Results for orders placed or performed in visit on 10/01/23  Hemoglobin A1c   Collection Time: 12/24/23  9:33 AM  Result Value Ref Range   Hgb A1c MFr Bld 5.7 (H) 4.8 - 5.6 %   Est. average glucose Bld gHb Est-mCnc 117 mg/dL  Lipid panel   Collection Time: 12/24/23  9:33 AM  Result Value Ref Range   Cholesterol, Total 201 (H) 100 - 199 mg/dL   Triglycerides 82 0 - 149 mg/dL   HDL 57 >56 mg/dL   VLDL Cholesterol Cal 15 5 - 40 mg/dL   LDL Chol Calc (NIH) 213 (H) 0 - 99 mg/dL   Chol/HDL Ratio 3.5 0.0 - 4.4 ratio  CBC with Differential/Platelet   Collection Time: 12/24/23  9:33 AM  Result Value Ref Range   WBC 7.6 3.4 - 10.8 x10E3/uL   RBC 4.57 3.77 - 5.28 x10E6/uL   Hemoglobin 11.4 11.1 - 15.9 g/dL   Hematocrit 08.6 57.8 - 46.6 %   MCV 79 79 - 97 fL   MCH 24.9 (L) 26.6 - 33.0 pg   MCHC 31.7 31.5 - 35.7 g/dL   RDW 46.9 (H) 62.9 - 52.8 %   Platelets 220 150 - 450 x10E3/uL   Neutrophils 51 Not Estab. %   Lymphs 36 Not Estab. %   Monocytes 8 Not Estab. %   Eos 4 Not Estab. %   Basos 1 Not Estab. %   Neutrophils Absolute 3.9 1.4 - 7.0 x10E3/uL   Lymphocytes Absolute 2.7 0.7 - 3.1 x10E3/uL   Monocytes Absolute 0.6 0.1 - 0.9 x10E3/uL   EOS (ABSOLUTE) 0.3 0.0 - 0.4 x10E3/uL   Basophils Absolute 0.1 0.0 - 0.2 x10E3/uL   Immature Granulocytes 0 Not Estab. %   Immature Grans (Abs) 0.0 0.0 - 0.1 x10E3/uL  TSH   Collection Time: 12/24/23  9:33 AM  Result Value Ref Range   TSH 1.340 0.450 - 4.500 uIU/mL  HIV Antibody (routine testing w rflx)   Collection Time: 12/24/23  9:33 AM  Result Value Ref Range   HIV Screen 4th Generation wRfx Non Reactive Non Reactive  Hepatitis C antibody   Collection Time: 12/24/23  9:33 AM  Result Value Ref Range   Hep C Virus Ab Non Reactive Non Reactive  VITAMIN D 25 Hydroxy (Vit-D Deficiency, Fractures)   Collection Time:  12/24/23  9:33 AM  Result Value Ref Range   Vit D, 25-Hydroxy 35.8 30.0 - 100.0 ng/mL  Vitamin B12   Collection Time: 12/24/23  9:33 AM  Result Value Ref Range   Vitamin B-12 398 232 - 1,245 pg/mL  Comprehensive metabolic panel   Collection Time: 12/24/23  9:33 AM  Result Value Ref Range   Glucose 102 (H) 70 - 99 mg/dL   BUN 14 6 - 24 mg/dL   Creatinine, Ser 4.13 (H) 0.57 - 1.00 mg/dL   eGFR 65 >24  mL/min/1.73   BUN/Creatinine Ratio 13 9 - 23   Sodium 143 134 - 144 mmol/L   Potassium 3.7 3.5 - 5.2 mmol/L   Chloride 103 96 - 106 mmol/L   CO2 27 20 - 29 mmol/L   Calcium 9.3 8.7 - 10.2 mg/dL   Total Protein 6.5 6.0 - 8.5 g/dL   Albumin 4.0 3.9 - 4.9 g/dL   Globulin, Total 2.5 1.5 - 4.5 g/dL   Bilirubin Total 0.3 0.0 - 1.2 mg/dL   Alkaline Phosphatase 70 44 - 121 IU/L   AST 9 0 - 40 IU/L   ALT 12 0 - 32 IU/L      Assessment & Plan:   Problem List Items Addressed This Visit     Pre-diabetes (Chronic)   Essential hypertension   HLD (hyperlipidemia)   Hyperthyroidism   Morbid obesity with BMI of 40.0-44.9, adult (HCC)   Other Visit Diagnoses       Annual physical exam    -  Primary     Screening for colon cancer       Relevant Orders   Cologuard     Vitamin B12 nutritional deficiency         Vitamin D deficiency         Chronic bilateral low back pain without sciatica            Updated Health Maintenance information Reviewed recent lab results with patient Encouraged improvement to lifestyle with diet and exercise Goal of weight loss   Hyperlipidemia LDL slightly elevated at 129, up from 121. Discussed dietary modifications to lower cholesterol. Low 10-year risk of heart attack or stroke (2.5%) based on current factors. No need for cholesterol medication at this time. -Continue dietary and lifestyle modifications to manage cholesterol levels.  Prediabetes A1c improved to 5.7 from previous 6.1-6.2 range. Discussed importance of maintaining this  trend. -Continue current management to maintain/improve A1c.  Iron Deficiency Anemia Slightly low MCV and MCH, but hemoglobin and hematocrit within normal range. Discussed potential benefits of additional iron supplementation. -Consider adding an additional iron pill per week. Currently on x 2 per week  Hypertension Blood pressure readings vary between home and office. Discussed importance of proper technique and potential benefit of bringing home cuff to next visit for comparison. -Continue current management and monitor blood pressure at home.  Morbid Obesity BMI >44 Patient has gained weight and is interested in weight loss. Discussed potential benefits of weight loss medications and provided information on options. Also discussed potential benefits of weight loss on back pain. -Consider weight loss medications and check insurance coverage. -Consider referral to [facility] weight management program.  Edema Patient reports swelling in ankles and feet. Discussed potential causes including diet, hydration, and weight. Current management with hydrochlorothiazide should help. -Continue current management and monitor for changes.   Due for routine colon cancer screening. Never had colonoscopy (not interested), no family history colon cancer. - Discussion today about recommendations for either Colonoscopy or Cologuard screening, benefits and risks of screening, interested in Cologuard, understands that if positive then recommendation is for diagnostic colonoscopy to follow-up. - Ordered Cologuard today Note previously ordered but not completed  Follow-up If starting weight loss medication, follow-up in 3 months. If not, follow-up can be in 6 months or at patient's preference.         Orders Placed This Encounter  Procedures   Cologuard    No orders of the defined types were placed in this encounter.  Follow up plan: Return in about 3 months (around 03/29/2024).  Saralyn Pilar, DO [facility] 12/31/2023, 8:56 AM

## 2024-04-02 ENCOUNTER — Other Ambulatory Visit: Payer: Self-pay | Admitting: Family Medicine

## 2024-04-02 DIAGNOSIS — I10 Essential (primary) hypertension: Secondary | ICD-10-CM

## 2024-04-02 DIAGNOSIS — R600 Localized edema: Secondary | ICD-10-CM

## 2024-04-03 NOTE — Telephone Encounter (Signed)
 Requested Prescriptions  Pending Prescriptions Disp Refills   hydrochlorothiazide  (HYDRODIURIL ) 25 MG tablet [Pharmacy Med Name: HYDROCHLOROTHIAZIDE  25MG  TABLETS] 90 tablet 1    Sig: TAKE 1 TABLET(25MG ) BY MOUTH DAILY     Cardiovascular: Diuretics - Thiazide Failed - 04/03/2024  2:55 PM      Failed - Cr in normal range and within 180 days    Creatinine, Ser  Date Value Ref Range Status  12/24/2023 1.07 (H) 0.57 - 1.00 mg/dL Final         Passed - K in normal range and within 180 days    Potassium  Date Value Ref Range Status  12/24/2023 3.7 3.5 - 5.2 mmol/L Final         Passed - Na in normal range and within 180 days    Sodium  Date Value Ref Range Status  12/24/2023 143 134 - 144 mmol/L Final         Passed - Last BP in normal range    BP Readings from Last 1 Encounters:  12/31/23 130/82         Passed - Valid encounter within last 6 months    Recent Outpatient Visits           3 months ago Annual physical exam   [facility] [place], Kayleen Party, DO

## 2024-06-09 ENCOUNTER — Other Ambulatory Visit: Payer: Self-pay | Admitting: Family Medicine

## 2024-06-09 DIAGNOSIS — Z1231 Encounter for screening mammogram for malignant neoplasm of breast: Secondary | ICD-10-CM

## 2024-07-06 ENCOUNTER — Other Ambulatory Visit: Payer: Self-pay | Admitting: Family Medicine

## 2024-07-06 DIAGNOSIS — I10 Essential (primary) hypertension: Secondary | ICD-10-CM

## 2024-07-06 DIAGNOSIS — R600 Localized edema: Secondary | ICD-10-CM

## 2024-07-07 ENCOUNTER — Ambulatory Visit
Admission: RE | Admit: 2024-07-07 | Discharge: 2024-07-07 | Disposition: A | Discharge status: Home / Self Care | Source: Ambulatory Visit | Attending: Family Medicine | Admitting: Family Medicine

## 2024-07-07 DIAGNOSIS — Z1231 Encounter for screening mammogram for malignant neoplasm of breast: Secondary | ICD-10-CM | POA: Diagnosis present

## 2024-07-08 NOTE — Telephone Encounter (Signed)
 Requested medication (s) are due for refill today: yes  Requested medication (s) are on the active medication list: yes  Last refill:  04/03/24 #90  Future visit scheduled: no  Notes to clinic:  overdue appt/needs lab work- sent MyChart message to make appt for refills   Requested Prescriptions  Pending Prescriptions Disp Refills   hydrochlorothiazide  (HYDRODIURIL ) 25 MG tablet [Pharmacy Med Name: HYDROCHLOROTHIAZIDE  25MG  TABLETS] 90 tablet 0    Sig: TAKE 1 TABLET(25MG ) BY MOUTH DAILY     Cardiovascular: Diuretics - Thiazide Failed - 07/08/2024 11:34 AM      Failed - Cr in normal range and within 180 days    Creatinine, Ser  Date Value Ref Range Status  12/24/2023 1.07 (H) 0.57 - 1.00 mg/dL Final         Failed - K in normal range and within 180 days    Potassium  Date Value Ref Range Status  12/24/2023 3.7 3.5 - 5.2 mmol/L Final         Failed - Na in normal range and within 180 days    Sodium  Date Value Ref Range Status  12/24/2023 143 134 - 144 mmol/L Final         Failed - Valid encounter within last 6 months    Recent Outpatient Visits           6 months ago Annual physical exam   [facility] [place] PARAS, DO       Future Appointments             In 3 months Raford Riggs, MD [facility] at [facility], [place]            Passed - Last BP in normal range    BP Readings from Last 1 Encounters:  12/31/23 130/82

## 2024-09-24 ENCOUNTER — Other Ambulatory Visit: Payer: Self-pay | Admitting: Interventional Radiology

## 2024-09-24 DIAGNOSIS — D259 Leiomyoma of uterus, unspecified: Secondary | ICD-10-CM

## 2024-10-03 ENCOUNTER — Other Ambulatory Visit: Payer: Self-pay | Admitting: Family Medicine

## 2024-10-03 DIAGNOSIS — R600 Localized edema: Secondary | ICD-10-CM

## 2024-10-03 DIAGNOSIS — I10 Essential (primary) hypertension: Secondary | ICD-10-CM

## 2024-10-05 NOTE — Telephone Encounter (Signed)
 Needs appointment- courtesy refill already given Requested Prescriptions  Pending Prescriptions Disp Refills   hydrochlorothiazide  (HYDRODIURIL ) 25 MG tablet [Pharmacy Med Name: HYDROCHLOROTHIAZIDE  25MG  TABLETS] 90 tablet 0    Sig: TAKE 1 TABLET(25MG ) BY MOUTH DAILY     Cardiovascular: Diuretics - Thiazide Failed - 10/05/2024  8:49 AM      Failed - Cr in normal range and within 180 days    Creatinine, Ser  Date Value Ref Range Status  12/24/2023 1.07 (H) 0.57 - 1.00 mg/dL Final         Failed - K in normal range and within 180 days    Potassium  Date Value Ref Range Status  12/24/2023 3.7 3.5 - 5.2 mmol/L Final         Failed - Na in normal range and within 180 days    Sodium  Date Value Ref Range Status  12/24/2023 143 134 - 144 mmol/L Final         Failed - Valid encounter within last 6 months    Recent Outpatient Visits           9 months ago Annual physical exam   [place] [facility] [place] PARAS, DO       Future Appointments             In 3 days Raford Riggs, MD [facility] at [facility], [place]            Passed - Last BP in normal range    BP Readings from Last 1 Encounters:  12/31/23 130/82

## 2024-10-06 ENCOUNTER — Ambulatory Visit
Admission: RE | Admit: 2024-10-06 | Discharge: 2024-10-06 | Disposition: A | Discharge status: Home / Self Care | Source: Ambulatory Visit | Attending: Interventional Radiology | Admitting: Interventional Radiology

## 2024-10-06 DIAGNOSIS — D259 Leiomyoma of uterus, unspecified: Secondary | ICD-10-CM

## 2024-10-06 MED ORDER — GADOPICLENOL 0.5 MMOL/ML IV SOLN
10.0000 mL | Freq: Once | INTRAVENOUS | Status: AC | PRN
  Administered 2024-10-06: 10 mL via INTRAVENOUS

## 2024-10-08 ENCOUNTER — Encounter (HOSPITAL_BASED_OUTPATIENT_CLINIC_OR_DEPARTMENT_OTHER): Payer: Self-pay | Admitting: Cardiovascular Disease

## 2024-10-08 ENCOUNTER — Ambulatory Visit (INDEPENDENT_AMBULATORY_CARE_PROVIDER_SITE_OTHER): Admitting: Cardiovascular Disease

## 2024-10-08 VITALS — BP 180/102 | HR 97 | Ht 60.0 in | Wt 229.9 lb

## 2024-10-08 DIAGNOSIS — R011 Cardiac murmur, unspecified: Secondary | ICD-10-CM

## 2024-10-08 DIAGNOSIS — R600 Localized edema: Secondary | ICD-10-CM

## 2024-10-08 DIAGNOSIS — Z79899 Other long term (current) drug therapy: Secondary | ICD-10-CM | POA: Diagnosis not present

## 2024-10-08 DIAGNOSIS — I10 Essential (primary) hypertension: Secondary | ICD-10-CM | POA: Diagnosis not present

## 2024-10-08 MED ORDER — SPIRONOLACTONE 25 MG PO TABS: 25.0000 mg | ORAL_TABLET | Freq: Every day | ORAL | 3 refills | Status: AC

## 2024-10-08 MED ORDER — HYDROCHLOROTHIAZIDE 25 MG PO TABS: 25.0000 mg | ORAL_TABLET | Freq: Every day | ORAL | 3 refills | Status: AC

## 2024-10-08 NOTE — Progress Notes (Signed)
 Advanced Hypertension Clinic Initial Assessment:    Date:  10/08/2024   ID:  [name], DOB 08-20-1976, MRN [id number]  PCP:  Edman Marsa PARAS, DO  Cardiologist:  None  Referring MD: Edman Marsa *   CC: Hypertension  History of Present Illness:    Alexis Moody is a 48 y.o. female with a hx of Graves disease here to establish care in the Advanced Hypertension Clinic.  She was referred by Dr. Rutherford.  SHe is seen by [facility] endocrinology for Graves disease.  Last TSH 0.058, free T4 1.2 on 08/2024.  She has been treated with methimazole and declined   Discussed the use of AI scribe software for clinical note transcription with the patient, who gave verbal consent to proceed.  History of Present Illness Ms. Alexis Moody has a history of hypertension since approximately 2009-2011, managed with hydrochlorothiazide . Home blood pressure readings are around 150/70 mmHg. She leads a sedentary lifestyle since working from home for the past two years and experiences swelling in her legs since her hypertension diagnosis. No orthopnea is reported.  She has hyperthyroidism, currently managed with methimazole. She experienced palpitations when she discontinued the medication for a month, prompting her to resume it. She is scheduled for lab work to monitor her thyroid  function.  She experiences lower back pain, described as muscle tightening, present for about a year. She recently underwent an MRI. She associates the onset of back pain with an increase in abdominal size due to fibroids.  Her family history is significant for hypertension in her mother, grandfather, and maternal aunts and uncle. There is no known history of heart attacks or strokes in the family.  She reports occasional alcohol consumption and drinks green tea with honey and lemon but does not consume much caffeine otherwise. She does not take any regular pain medications but uses Tylenol if needed. No regular use of  supplements or herbs.  Previous antihypertensives:  Past Medical History:  Diagnosis Date   Arthritis    Hyperlipidemia    Hypertension    Obesity    Obesity, Class II, BMI 35-39.9, with comorbidity    Thyroid  disease     Past Surgical History:  Procedure Laterality Date   COSMETIC SURGERY  2001   Breast reduction   REDUCTION MAMMAPLASTY      Current Medications: Current Meds  Medication Sig   Cholecalciferol (VITAMIN D ) 2000 UNITS CAPS Take by mouth daily.    ferrous sulfate 324 MG TBEC Take 324 mg by mouth.   methimazole (TAPAZOLE) 5 MG tablet Take by mouth.   spironolactone  (ALDACTONE ) 25 MG tablet Take 1 tablet (25 mg total) by mouth daily.   [DISCONTINUED] hydrochlorothiazide  (HYDRODIURIL ) 25 MG tablet TAKE 1 TABLET(25MG ) BY MOUTH DAILY     Allergies:   Patient has no known allergies.   Social History   Socioeconomic History   Marital status: Married    Spouse name: Not on file   Number of children: Not on file   Years of education: Not on file   Highest education level: Not on file  Occupational History   Not on file  Tobacco Use   Smoking status: Former    Current packs/day: 0.00    Types: Cigarettes    Start date: 11/13/1994    Quit date: 11/14/1999    Years since quitting: 24.9    Passive exposure: Never   Smokeless tobacco: Never  Vaping Use   Vaping status: Former  Substance and Sexual Activity  Alcohol use: Yes    Alcohol/week: 1.0 standard drink of alcohol    Comment: occasional   Drug use: Never   Sexual activity: Yes    Partners: Male    Birth control/protection: None  Other Topics Concern   Not on file  Social History Narrative   Not on file   Social Drivers of Health   Financial Resource Strain: Low Risk  (10/08/2024)   Overall Financial Resource Strain (CARDIA)    Difficulty of Paying Living Expenses: Not hard at all  Food Insecurity: No Food Insecurity (10/08/2024)   Hunger Vital Sign    Worried About Running Out of Food in the  Last Year: Never true    Ran Out of Food in the Last Year: Never true  Transportation Needs: No Transportation Needs (10/08/2024)   PRAPARE - Administrator, Civil Service (Medical): No    Lack of Transportation (Non-Medical): No  Physical Activity: Inactive (10/08/2024)   Exercise Vital Sign    Days of Exercise per Week: 0 days    Minutes of Exercise per Session: 0 min  Stress: No Stress Concern Present (10/08/2024)   [employer] of Occupational Health - Occupational Stress Questionnaire    Feeling of Stress: Not at all  Social Connections: Moderately Isolated (10/08/2024)   Social Connection and Isolation Panel    Frequency of Communication with Friends and Family: More than three times a week    Frequency of Social Gatherings with Friends and Family: Once a week    Attends Religious Services: Never    Database Administrator or Organizations: No    Attends Engineer, Structural: Never    Marital Status: Married     Family History: The patient's family history includes Breast cancer (age of onset: 33) in her mother; Diabetes in her father; Hyperlipidemia in her father and mother; Hypertension in her father, maternal grandfather, maternal uncle, and mother; Leukemia in her cousin; Multiple sclerosis in her maternal grandmother. There is no history of Stroke or Heart disease.  ROS:   Please see the history of present illness.     All other systems reviewed and are negative.  EKGs/Labs/Other Studies Reviewed:    EKG:  EKG is ordered today.    EKG Interpretation Date/Time:  Wednesday October 08 2024 11:44:13 EST Ventricular Rate:  97 PR Interval:  192 QRS Duration:  74 QT Interval:  352 QTC Calculation: 447 R Axis:   16  Text Interpretation: Normal sinus rhythm Normal ECG When compared with ECG of 20-Nov-2005 02:56, No significant change was found Confirmed by Raford Riggs ([id number]) on 10/08/2024 11:49:57 AM        Recent Labs: 12/24/2023: ALT  12; BUN 14; Creatinine, Ser 1.07; Hemoglobin 11.4; Platelets 220; Potassium 3.7; Sodium 143; TSH 1.340   Recent Lipid Panel    Component Value Date/Time   CHOL 201 (H) 12/24/2023 0933   TRIG 82 12/24/2023 0933   HDL 57 12/24/2023 0933   CHOLHDL 3.5 12/24/2023 0933   LDLCALC 129 (H) 12/24/2023 0933    Physical Exam:   VS:  BP (!) 180/102 (BP Location: Left Arm, Patient Position: Sitting, Cuff Size: Large)   Pulse 97   Ht 5' (1.524 m)   Wt 229 lb 14.4 oz (104.3 kg)   SpO2 98%   BMI 44.90 kg/m  , BMI Body mass index is 44.9 kg/m. GENERAL:  Well appearing HEENT: Pupils equal round and reactive, fundi not visualized, oral mucosa unremarkable NECK:  No  jugular venous distention, waveform within normal limits, carotid upstroke brisk and symmetric, no bruits, no thyromegaly LUNGS:  Clear to auscultation bilaterally HEART:  RRR.  PMI not displaced or sustained,S1 and S2 within normal limits, no S3, no S4, no clicks, no rubs, II/VI systolic murmur at the LUSB ABD:  Flat, positive bowel sounds normal in frequency in pitch, no bruits, no rebound, no guarding, no midline pulsatile mass, no hepatomegaly, no splenomegaly EXT:  2 plus pulses throughout, 1+ LE edema, no cyanosis no clubbing SKIN:  No rashes no nodules NEURO:  Cranial nerves II through XII grossly intact, motor grossly intact throughout PSYCH:  Cognitively intact, oriented to person place and time   ASSESSMENT/PLAN:     Assessment & Plan # Hypertension with lower extremity edema Hypertension with readings around 150/70 mmHg. Lower extremity edema noted. Potential secondary causes to be evaluated due to early onset. Discussed lifestyle modifications for blood pressure management. - Ordered echocardiogram to evaluate heart structure and function. - Ordered renal artery Dopplers to assess for renovascular hypertension. - Ordered morning blood work to check for hyperaldosteronism.  Renin and aldosterone check before starting  spironolactone .   - Prescribed spironolactone  25mg  daily for hypertension and edema. - Refilled hydrochlorothiazide  prescription. - Provided blood pressure tracking sheet. - Advised on lifestyle modifications: reduce salt intake, increase physical activity, use compression socks, consider standing desk.  # Hyperthyroidism Managed with methimazole. Recent TSH low, T4 normal. Symptoms include heart racing, possibly thyroid -related. Awaiting further lab evaluation. - Continue methimazole as prescribed. - Await upcoming lab results for thyroid  function. - Consider beta blocker for BP control   # Heart murmur Slight murmur detected, possibly related to hyperthyroidism or hypertension with LVH. Echocardiogram planned for further evaluation. - Ordered echocardiogram to assess heart murmur.    Screening for Secondary Hypertension:     10/08/2024   11:53 AM  Causes  Drugs/Herbals Screened     - Comments minimal caffeine.  + salt.  rare EtOH  Renovascular HTN Screened     - Comments Check renal artery Dopplers  Sleep Apnea Screened     - Comments snores at times.  no other symptoms  Thyroid  Disease Screened     - Comments Grave's disease  Hyperaldosteronism Screened     - Comments check renin/aldosterone  Pheochromocytoma N/A  Cushing's Syndrome Screened     - Comments check cortisol  Hyperparathyroidism Screened  Coarctation of the Aorta Screened     - Comments BP symmetric  Compliance Screened    Relevant Labs/Studies:    Latest Ref Rng & Units 12/24/2023    9:33 AM 12/13/2022   10:40 AM 02/23/2020   11:45 AM  Basic Labs  Sodium 134 - 144 mmol/L 143  140  140   Potassium 3.5 - 5.2 mmol/L 3.7  4.1  3.6   Creatinine 0.57 - 1.00 mg/dL 8.92  9.05  9.44        Latest Ref Rng & Units 12/24/2023    9:33 AM 03/01/2018    8:50 AM  Thyroid    TSH 0.450 - 4.500 uIU/mL 1.340  0.796                 10/08/2024   12:10 PM  Renovascular   Renal Artery US  Completed Yes      Disposition:    FU with MD/PharmD in 3 months    Medication Adjustments/Labs and Tests Ordered: Current medicines are reviewed at length with the patient today.  Concerns regarding medicines are outlined  above.  Orders Placed This Encounter  Procedures   Cortisol   Aldosterone + renin activity w/ ratio   Basic Metabolic Panel (BMET)   EKG 12-Lead   ECHOCARDIOGRAM COMPLETE   VAS US  RENAL ARTERY DUPLEX   Meds ordered this encounter  Medications   hydrochlorothiazide  (HYDRODIURIL ) 25 MG tablet    Sig: Take 1 tablet (25 mg total) by mouth daily.    Dispense:  90 tablet    Refill:  3   spironolactone  (ALDACTONE ) 25 MG tablet    Sig: Take 1 tablet (25 mg total) by mouth daily.    Dispense:  90 tablet    Refill:  3     Signed, Annabella Scarce, MD  10/08/2024 3:44 PM    [facility]

## 2024-10-08 NOTE — Patient Instructions (Signed)
 Medication Instructions:   START Aldactone  one (1) tablet by mouth ( 25 mg) daily. To start after your labs with your PCP.   *If you need a refill on your cardiac medications before your next appointment, please call your pharmacy*  Lab Work:  Please get labs at your PCP/Cortisol,Aldosterone + renin  Your physician recommends that you return for lab work in one week after starting Aldactone .   If you have labs (blood work) drawn today and your tests are completely normal, you will receive your results only by: MyChart Message (if you have MyChart) OR A paper copy in the mail If you have any lab test that is abnormal or we need to change your treatment, we will call you to review the results.  Testing/Procedures:  Your physician has requested that you have a renal artery duplex. During this test, an ultrasound is used to evaluate blood flow to the kidneys. Allow one hour for this exam. Do not eat after midnight the day before and avoid carbonated beverages. Take your medications as you usually do.  Your physician has requested that you have an echocardiogram. Echocardiography is a painless test that uses sound waves to create images of your heart. It provides your doctor with information about the size and shape of your heart and how well your heart's chambers and valves are working. This procedure takes approximately one hour. There are no restrictions for this procedure. Please do NOT wear cologne, perfume or lotions (deodorant is allowed). Please arrive 15 minutes prior to your appointment time.  Please note: We ask at that you not bring children with you during ultrasound (echo/ vascular) testing. Due to room size and safety concerns, children are not allowed in the ultrasound rooms during exams. Our front office staff cannot provide observation of children in our lobby area while testing is being conducted. An adult accompanying a patient to their appointment will only be allowed in the  ultrasound room at the discretion of the ultrasound technician under special circumstances. We apologize for any inconvenience.   Follow-Up: At [facility], you and your health needs are our priority.  As part of our continuing mission to provide you with exceptional heart care, our providers are all part of one team.  This team includes your primary Cardiologist (physician) and Advanced Practice Providers or APPs (Physician Assistants and Nurse Practitioners) who all work together to provide you with the care you need, when you need it.  Your next appointment:   3 month(s)  Provider:   Annabella Scarce, MD    We recommend signing up for the patient portal called MyChart.  Sign up information is provided on this After Visit Summary.  MyChart is used to connect with patients for Virtual Visits (Telemedicine).  Patients are able to view lab/test results, encounter notes, upcoming appointments, etc.  Non-urgent messages can be sent to your provider as well.   To learn more about what you can do with MyChart, go to [email or web address].

## 2024-10-14 ENCOUNTER — Inpatient Hospital Stay
Admission: RE | Admit: 2024-10-14 | Discharge: 2024-10-14 | Disposition: A | Source: Ambulatory Visit | Attending: Interventional Radiology

## 2024-10-14 DIAGNOSIS — D259 Leiomyoma of uterus, unspecified: Secondary | ICD-10-CM

## 2024-10-14 LAB — ALDOSTERONE + RENIN ACTIVITY W/ RATIO
Aldos/Renin Ratio: 10.5 (ref 0.0–30.0)
Aldosterone: 12.2 ng/dL (ref 0.0–30.0)
Renin Activity, Plasma: 1.167 ng/mL/h (ref 0.167–5.380)

## 2024-10-14 LAB — CORTISOL: Cortisol: 6.1 ug/dL — ABNORMAL LOW (ref 6.2–19.4)

## 2024-10-15 NOTE — Consult Note (Signed)
 Chief Complaint: Menorrhagia with uterine fibroids  Referring Provider(s): Suttle,Dylan J   Patient Status: [facility]- Out-pt  History of Present Illness: Alexis Moody is a 48 y.o. female G2P1IA1 diagnosed with multiple uterine fibroids.  She complains heavy menstrual flow and clots with a very irregular period /cycle.  On her heavy flow days she has to change pads every hour and other days every 2 hours.  MRI 10/06/24 demonstrates many fibroids.  Pap smear 7/25 negative.  No further planned pregnancies.  Previous breast reduction surgery with no complications to anesthesia.  One previous D and C. I discussed pathophysiology of uterine fibroids and the usual course of growth.  We also discussed conservative management with Lupron.  Surgery was also discussed.  The patient is more interested in minimally invasive therapy like uterine artery embolization.  We discussed UAE in detail including, risks, benefits, and expected outcomes.  Pre and post procedure also discussed in detail.  The patient had numerous questions all of which were answered to her satisfaction.  Verbal consent for the procedure was obtained and written consent will be obtained on the day of the procedure.    Past Medical History:  Diagnosis Date   Arthritis    Hyperlipidemia    Hypertension    Obesity    Obesity, Class II, BMI 35-39.9, with comorbidity    Thyroid  disease     Past Surgical History:  Procedure Laterality Date   COSMETIC SURGERY  2001   Breast reduction   REDUCTION MAMMAPLASTY      Allergies: Patient has no known allergies.  Medications: Prior to Admission medications   Medication Sig Start Date End Date Taking? Authorizing Provider  Cholecalciferol (VITAMIN D ) 2000 UNITS CAPS Take by mouth daily.     [provider]  ferrous sulfate 324 MG TBEC Take 324 mg by mouth.    [provider]  hydrochlorothiazide  (HYDRODIURIL ) 25 MG tablet Take 1 tablet (25 mg total) by mouth  daily. 10/08/24   Raford Riggs, MD  methimazole (TAPAZOLE) 5 MG tablet Take by mouth. 09/05/22   [provider]  spironolactone  (ALDACTONE ) 25 MG tablet Take 1 tablet (25 mg total) by mouth daily. 10/08/24 01/06/25  Raford Riggs, MD     Family History  Problem Relation Age of Onset   Hyperlipidemia Mother    Hypertension Mother    Breast cancer Mother 16   Hyperlipidemia Father    Hypertension Father    Diabetes Father    Hypertension Maternal Uncle    Multiple sclerosis Maternal Grandmother    Hypertension Maternal Grandfather    Leukemia Cousin    Stroke Neg Hx    Heart disease Neg Hx     Social History   Socioeconomic History   Marital status: Married    Spouse name: Not on file   Number of children: Not on file   Years of education: Not on file   Highest education level: Not on file  Occupational History   Not on file  Tobacco Use   Smoking status: Former    Current packs/day: 0.00    Types: Cigarettes    Start date: 11/13/1994    Quit date: 11/14/1999    Years since quitting: 24.9    Passive exposure: Never   Smokeless tobacco: Never  Vaping Use   Vaping status: Former  Substance and Sexual Activity   Alcohol use: Yes    Alcohol/week: 1.0 standard drink of alcohol    Comment: occasional   Drug  use: Never   Sexual activity: Yes    Partners: Male    Birth control/protection: None  Other Topics Concern   Not on file  Social History Narrative   Not on file   Social Drivers of Health   Financial Resource Strain: Low Risk  (10/08/2024)   Overall Financial Resource Strain (CARDIA)    Difficulty of Paying Living Expenses: Not hard at all  Food Insecurity: No Food Insecurity (10/08/2024)   Hunger Vital Sign    Worried About Running Out of Food in the Last Year: Never true    Ran Out of Food in the Last Year: Never true  Transportation Needs: No Transportation Needs (10/08/2024)   PRAPARE - Administrator, Civil Service (Medical):  No    Lack of Transportation (Non-Medical): No  Physical Activity: Inactive (10/08/2024)   Exercise Vital Sign    Days of Exercise per Week: 0 days    Minutes of Exercise per Session: 0 min  Stress: No Stress Concern Present (10/08/2024)   [employer] of Occupational Health - Occupational Stress Questionnaire    Feeling of Stress: Not at all  Social Connections: Moderately Isolated (10/08/2024)   Social Connection and Isolation Panel    Frequency of Communication with Friends and Family: More than three times a week    Frequency of Social Gatherings with Friends and Family: Once a week    Attends Religious Services: Never    Database Administrator or Organizations: No    Attends Engineer, Structural: Never    Marital Status: Married     Review of Systems: A 12 point ROS discussed and pertinent positives are indicated in the HPI above.  All other systems are negative.  Review of Systems  Vital Signs: BP (!) 216/110 (BP Location: Left Arm, Patient Position: Sitting, Cuff Size: Large)   Pulse (!) 108   Temp 98.6 F (37 C) (Oral)   Resp 16   Wt 103.9 kg   SpO2 96%   BMI 44.72 kg/m     Physical Exam Constitutional:      Appearance: Normal appearance.  Cardiovascular:     Rate and Rhythm: Normal rate and regular rhythm.     Pulses: Normal pulses.  Pulmonary:     Effort: Pulmonary effort is normal.     Breath sounds: Normal breath sounds.  Skin:    General: Skin is warm and dry.  Neurological:     Mental Status: She is alert.  Psychiatric:        Mood and Affect: Mood normal.        Behavior: Behavior normal.        Thought Content: Thought content normal.        Judgment: Judgment normal.     Imaging: MR PELVIS W WO CONTRAST Result Date: 10/06/2024 CLINICAL DATA:  History provided by technologist  Heavy periods, bloated stomach and back pain; Fibroids. Duration: 1 year NKI, no hx of ca, no hx of surgery Hx of HTN, hx of fibroids, hx of  Hyperthyroidism due to Graves' Disease, takes HTN medication. EXAM: MRI PELVIS WITHOUT AND WITH CONTRAST TECHNIQUE: Multiplanar multisequence MR imaging of the pelvis was performed both before and after administration of intravenous contrast. CONTRAST:  10 mL of Vueway . COMPARISON:  None Available. FINDINGS: Urinary Tract: Limited evaluation of bilateral kidneys on coronal T2 weighted images. No focal renal lesions seen. No hydroureteronephrosis. Urinary bladder is partially distended and appears grossly within normal limits. Bowel:  Unremarkable  visualized pelvic bowel loops. Vascular/Lymphatic: No pathologically enlarged lymph nodes. No significant vascular abnormality seen. Reproductive: Bulky lobulated anteverted uterus noted measuring up to 9.0 x 15.5 cm orthogonally on sagittal plane. There are innumerable well-circumscribed enhancing uterine lesions, favored to represent leiomyomas. Majority of the leiomyomas are intramural/subserosal however, there are few with submucosal component with largest in the anterior uterine body measuring up to 3.1 x 4.8 cm with approximately 50% submucosal component (series 5, image 9). There is also a pedunculated subserosal leiomyoma arising from the left fundal region measuring 3.7 x 5.3 cm (series 6, image 28). The endometrium is distorted by leiomyomas however, appears within normal limits. No focal endometrial lesion. The cervix and vagina appears within normal limits. Incidental note is made of several nabothian cysts in the cervix. Bilateral ovaries are visualized and appears within normal limits. Other:  None. Musculoskeletal: No suspicious bone lesions identified. IMPRESSION: 1. Bulky lobulated uterus secondary to multiple leiomyomas, as described in detail above. 2. No focal endometrial lesion. 3. Bilateral ovaries are within normal limits. Electronically Signed   By: Ree Molt M.D.   On: 10/06/2024 10:44    Labs:  CBC: Recent Labs    12/24/23 0933  WBC  7.6  HGB 11.4  HCT 36.0  PLT 220    COAGS: No results for input(s): INR, APTT in the last 8760 hours.  BMP: Recent Labs    12/24/23 0933  NA 143  K 3.7  CL 103  CO2 27  GLUCOSE 102*  BUN 14  CALCIUM 9.3  CREATININE 1.07*    LIVER FUNCTION TESTS: Recent Labs    12/24/23 0933  BILITOT 0.3  AST 9  ALT 12  ALKPHOS 70  PROT 6.5  ALBUMIN 4.0    TUMOR MARKERS: No results for input(s): AFPTM, CEA, CA199, CHROMGRNA in the last 8760 hours.  Assessment and Plan:  Symptomatic uterine fibroids in a patient interested in minimally invasive uterine artery embolization procedure for symptomatic relief.  The procedure will be scheduled with moderate sedation with the plan of using the right radial artery for access.    Electronically Signed: Cordella DELENA Banner, MD   10/15/2024, 4:37 PM     I spent a total of  40 Minutes   in face to face in clinical consultation, greater than 50% of which was counseling/coordinating care for symptomatic uterine fibroids.

## 2024-10-17 ENCOUNTER — Ambulatory Visit (HOSPITAL_BASED_OUTPATIENT_CLINIC_OR_DEPARTMENT_OTHER): Payer: Self-pay | Admitting: Family

## 2024-10-17 LAB — BASIC METABOLIC PANEL WITH GFR
BUN/Creatinine Ratio: 18 (ref 9–23)
BUN: 16 mg/dL (ref 6–24)
CO2: 24 mmol/L (ref 20–29)
Calcium: 9.1 mg/dL (ref 8.7–10.2)
Chloride: 100 mmol/L (ref 96–106)
Creatinine, Ser: 0.9 mg/dL (ref 0.57–1.00)
Glucose: 94 mg/dL (ref 70–99)
Potassium: 4 mmol/L (ref 3.5–5.2)
Sodium: 139 mmol/L (ref 134–144)
eGFR: 80 mL/min/1.73 (ref >59)

## 2024-10-20 ENCOUNTER — Encounter: Payer: Self-pay | Admitting: Interventional Radiology

## 2024-10-21 ENCOUNTER — Inpatient Hospital Stay: Admission: RE | Admit: 2024-10-21 | Discharge: 2024-10-21 | Attending: Emergency Medicine

## 2024-10-21 ENCOUNTER — Encounter (HOSPITAL_COMMUNITY): Payer: Self-pay

## 2024-10-21 ENCOUNTER — Other Ambulatory Visit (HOSPITAL_COMMUNITY): Payer: Self-pay

## 2024-10-21 ENCOUNTER — Ambulatory Visit (INDEPENDENT_AMBULATORY_CARE_PROVIDER_SITE_OTHER)

## 2024-10-21 VITALS — BP 145/86 | HR 98 | Temp 97.9°F | Resp 18

## 2024-10-21 DIAGNOSIS — M109 Gout, unspecified: Secondary | ICD-10-CM | POA: Diagnosis not present

## 2024-10-21 DIAGNOSIS — D259 Leiomyoma of uterus, unspecified: Secondary | ICD-10-CM

## 2024-10-21 MED ORDER — COLCHICINE 0.6 MG PO TABS: ORAL_TABLET | ORAL | 0 refills | Status: AC

## 2024-10-21 NOTE — ED Triage Notes (Addendum)
 Patient to Urgent Care with complaints of left sided foot pain. Reports pain any swelling around first three toes. Itching.   Denies any known injury.  Symptoms started Saturday. Tylenol Sunday. Elevated last night.

## 2024-10-21 NOTE — ED Provider Notes (Signed)
 Alexis Moody    CSN: [id number] Arrival date & time: 10/21/24  9157      History   Chief Complaint Chief Complaint  Patient presents with   Foot Pain    Entered by patient    HPI Alexis Moody is a 48 y.o. female.  Patient present with 3-day of left foot pain, redness, and swelling, primarily at the base of her great toe.  She reports mild itching around her toes.  Her pain is worse with palpation, weight bearing, walking.  No trauma.  No  OTC medication taken today.  No wounds, numbness, weakness, paresthesias, fever.  Her medical history includes arthritis, hypertension, hyperlipidemia, thyroid  disease, obesity.  The history is provided by the patient and medical records.    Past Medical History:  Diagnosis Date   Arthritis    Hyperlipidemia    Hypertension    Obesity    Obesity, Class II, BMI 35-39.9, with comorbidity    Thyroid  disease     Patient Active Problem List   Diagnosis Date Noted   Hyperthyroidism 09/19/2022   Bilateral lower extremity edema 09/19/2022   Achilles tendinitis of right lower extremity 09/19/2022   Microcytosis 05/17/2016   Essential hypertension 08/16/2015   HLD (hyperlipidemia) 08/16/2015   Morbid obesity with BMI of 40.0-44.9, adult (HCC) 08/16/2015   Pre-diabetes 08/16/2015    Past Surgical History:  Procedure Laterality Date   COSMETIC SURGERY  2001   Breast reduction   REDUCTION MAMMAPLASTY      OB History   No obstetric history on file.      Home Medications    Prior to Admission medications   Medication Sig Start Date End Date Taking? Authorizing Provider  colchicine  0.6 MG tablet Take 2 tablets by mouth now.  Then take 1 tablet by mouth one hour later.  May repeat in 3 days if needed. 10/21/24  Yes Corlis Burnard DEL, NP  Cholecalciferol (VITAMIN D ) 2000 UNITS CAPS Take by mouth daily.     [provider]  ferrous sulfate 324 MG TBEC Take 324 mg by mouth.    [provider]  hydrochlorothiazide   (HYDRODIURIL ) 25 MG tablet Take 1 tablet (25 mg total) by mouth daily. 10/08/24   Raford Riggs, MD  methimazole (TAPAZOLE) 5 MG tablet Take by mouth. 09/05/22   [provider]  spironolactone  (ALDACTONE ) 25 MG tablet Take 1 tablet (25 mg total) by mouth daily. 10/08/24 01/06/25  Raford Riggs, MD    Family History Family History  Problem Relation Age of Onset   Hyperlipidemia Mother    Hypertension Mother    Breast cancer Mother 72   Hyperlipidemia Father    Hypertension Father    Diabetes Father    Hypertension Maternal Uncle    Multiple sclerosis Maternal Grandmother    Hypertension Maternal Grandfather    Leukemia Cousin    Stroke Neg Hx    Heart disease Neg Hx     Social History Social History   Tobacco Use   Smoking status: Former    Current packs/day: 0.00    Types: Cigarettes    Start date: 11/13/1994    Quit date: 11/14/1999    Years since quitting: 24.9    Passive exposure: Never   Smokeless tobacco: Never  Vaping Use   Vaping status: Former  Substance Use Topics   Alcohol use: Yes    Alcohol/week: 1.0 standard drink of alcohol    Comment: occasional   Drug use: Never  Allergies   Patient has no known allergies.   Review of Systems Review of Systems  Constitutional:  Negative for chills and fever.  Musculoskeletal:  Positive for arthralgias, gait problem and joint swelling.  Skin:  Positive for color change. Negative for wound.  Neurological:  Negative for weakness and numbness.     Physical Exam Triage Vital Signs ED Triage Vitals  Encounter Vitals Group     BP 10/21/24 0856 (!) 145/86     Girls Systolic BP Percentile --      Girls Diastolic BP Percentile --      Boys Systolic BP Percentile --      Boys Diastolic BP Percentile --      Pulse Rate 10/21/24 0856 98     Resp 10/21/24 0856 18     Temp 10/21/24 0856 97.9 F (36.6 C)     Temp src --      SpO2 10/21/24 0856 99 %     Weight --      Height --      Head  Circumference --      Peak Flow --      Pain Score 10/21/24 0858 4     Pain Loc --      Pain Education --      Exclude from Growth Chart --    No data found.  Updated Vital Signs BP (!) 145/86   Pulse 98   Temp 97.9 F (36.6 C)   Resp 18   LMP 10/18/2024   SpO2 99%   Visual Acuity Right Eye Distance:   Left Eye Distance:   Bilateral Distance:    Right Eye Near:   Left Eye Near:    Bilateral Near:     Physical Exam Constitutional:      General: She is not in acute distress. HENT:     Mouth/Throat:     Mouth: Mucous membranes are moist.  Cardiovascular:     Rate and Rhythm: Normal rate.  Pulmonary:     Effort: Pulmonary effort is normal. No respiratory distress.  Musculoskeletal:        General: Swelling and tenderness present. No deformity. Normal range of motion.       Feet:  Skin:    General: Skin is warm and dry.     Capillary Refill: Capillary refill takes less than 2 seconds.     Findings: Erythema present. No lesion.  Neurological:     General: No focal deficit present.     Mental Status: She is alert.     Sensory: No sensory deficit.     Motor: No weakness.     Gait: Gait abnormal.     Comments: Limping gait      UC Treatments / Results  Labs (all labs ordered are listed, but only abnormal results are displayed) Labs Reviewed - No data to display  EKG   Radiology DG Foot Complete Left Result Date: 10/21/2024 CLINICAL DATA:  Left-sided foot pain EXAM: LEFT FOOT - COMPLETE 3+ VIEW COMPARISON:  None Available. FINDINGS: There is no evidence of fracture or dislocation. There is no evidence of arthropathy or other focal bone abnormality. Soft tissues are unremarkable. IMPRESSION: Negative. Electronically Signed   By: Camellia Candle M.D.   On: 10/21/2024 09:25    Procedures Procedures (including critical care time)  Medications Ordered in UC Medications - No data to display  Initial Impression / Assessment and Plan / UC Course  I have  reviewed the triage vital  signs and the nursing notes.  Pertinent labs & imaging results that were available during my care of the patient were reviewed by me and considered in my medical decision making (see chart for details).    Gout of the left great toe.  Afebrile and vital signs are stable.  No trauma; X-ray negative.  Treating with colchicine .  Education provided on gout.  Instructed patient to avoid NSAIDs for the remainder of the day.  Instructed her to follow-up with her PCP.  She agrees to plan of care.  Final Clinical Impressions(s) / UC Diagnoses   Final diagnoses:  Acute gout involving toe of left foot, unspecified cause     Discharge Instructions      Take the colchicine  as directed.  As discussed, do not take any OTC NSAID medications for the rest of the day.    See the attached information on gout.  Follow-up with your primary care provider.     ED Prescriptions     Medication Sig Dispense Auth. Provider   colchicine  0.6 MG tablet Take 2 tablets by mouth now.  Then take 1 tablet by mouth one hour later.  May repeat in 3 days if needed. 6 tablet Corlis Burnard DEL, NP      PDMP not reviewed this encounter.   Corlis Burnard DEL, NP 10/21/24 [phone]

## 2024-10-21 NOTE — Discharge Instructions (Addendum)
 Take the colchicine  as directed.  As discussed, do not take any OTC NSAID medications for the rest of the day.    See the attached information on gout.  Follow-up with your primary care provider.

## 2024-10-22 ENCOUNTER — Telehealth: Payer: Self-pay

## 2024-10-22 NOTE — Telephone Encounter (Signed)
 Copied from CRM #[id number]. Topic: Appointments - Scheduling Inquiry for Clinic >> Oct 22, 2024  3:28 PM Antwanette L wrote: Reason for CRM: Patient has an appt on 12/29 at 9 am with Dr. Larose for a urgent care follow up. She is requesting to have her blood pressure checked during that visit.

## 2024-11-10 ENCOUNTER — Encounter: Payer: Self-pay | Admitting: Family Medicine

## 2024-11-10 ENCOUNTER — Ambulatory Visit: Admitting: Family Medicine

## 2024-11-10 ENCOUNTER — Other Ambulatory Visit: Payer: Self-pay | Admitting: Family Medicine

## 2024-11-10 VITALS — BP 152/80 | HR 88 | Ht 60.0 in | Wt 228.0 lb

## 2024-11-10 DIAGNOSIS — E538 Deficiency of other specified B group vitamins: Secondary | ICD-10-CM

## 2024-11-10 DIAGNOSIS — Z0000 Encounter for general adult medical examination without abnormal findings: Secondary | ICD-10-CM

## 2024-11-10 DIAGNOSIS — I10 Essential (primary) hypertension: Secondary | ICD-10-CM

## 2024-11-10 DIAGNOSIS — E782 Mixed hyperlipidemia: Secondary | ICD-10-CM

## 2024-11-10 DIAGNOSIS — E0500 Thyrotoxicosis with diffuse goiter without thyrotoxic crisis or storm: Secondary | ICD-10-CM | POA: Insufficient documentation

## 2024-11-10 DIAGNOSIS — Z789 Other specified health status: Secondary | ICD-10-CM

## 2024-11-10 DIAGNOSIS — R7303 Prediabetes: Secondary | ICD-10-CM

## 2024-11-10 DIAGNOSIS — E559 Vitamin D deficiency, unspecified: Secondary | ICD-10-CM

## 2024-11-10 DIAGNOSIS — M1A0720 Idiopathic chronic gout, left ankle and foot, without tophus (tophi): Secondary | ICD-10-CM | POA: Insufficient documentation

## 2024-11-10 DIAGNOSIS — E0590 Thyrotoxicosis, unspecified without thyrotoxic crisis or storm: Secondary | ICD-10-CM

## 2024-11-10 NOTE — Patient Instructions (Addendum)
 Thank you for coming to the office today.  - Gout is a chronic problem that will have episodic flare ups with pain, redness, swelling of a joint, most common spots are big toe, foot and ankle, knee or sometimes hands or wrists. It is caused by small crystals made of Uric Acid that form in the joint causing pain and swelling.  IF YOU GET ANOTHER GOUT FLARE Start Colchicine  (Mitigare ) today - First dose take 2 tabs (1.2mg  total), wait 1 hour then take 1 tab (0.6mg ). Then next day start with 1 tab daily until pain resolves, if after 3 days pain is not resolving then you can increase to 1 tab twice a day until resolved. Continue same dose you were on when pain resolved for about 2-3 days AFTER pain is completely gone.   If pain does not significantly improve within 48 to 72 hours, please notify us  and we can send in a Prednisone burst instead (stop taking Colchicine  / or other anti-inflammatory), then once resolved, then start the preventative Colchicine  dose of 1 pill a day for 1 month.  Gout flares can repeat again soon after they resolve in the same spot or other joints, and may need repeat treatment.  Our goal is to prevent future gout flares. Try to avoid dietary triggers that are the most common causes of gout flares.  - Avoid the following foods/drinks: - Red meat, organ meat (liver) - Alcohol (especially beer, also wine, liquor) - Processed foods / carbs (white bread, white rice, pasta, sugar) - Sugary drinks (sweet tea, soda) - Shellfish, shrimp / lobster  - Foods that are preferred to eat: - Beans, Lentils, Whole grains, Quinoa - Fruits, Vegetables - Dairy, Cheese, Yogurt - Soy based protein   Please schedule a Follow-up Appointment to: Return in about 2 months (around 01/10/2025) for 2 month Annual Physical ([facility] orders in advance).  If you have any other questions or concerns, please feel free to call the office or send a message through MyChart. You may also schedule an  earlier appointment if necessary.  Additionally, you may be receiving a survey about your experience at our office within a few days to 1 week by e-mail or mail. We value your feedback.  Marsa Officer, DO [facility], [place]

## 2024-11-10 NOTE — Progress Notes (Signed)
 "  Subjective:    Patient ID: Alexis Moody, female    DOB: [birth year], 48 y.o.   MRN: [id number]  Alexis Moody is a 48 y.o. female presenting on 11/10/2024 for Hypertension (And possible gout flare up)   HPI  Discussed the use of AI scribe software for clinical note transcription with the patient, who gave verbal consent to proceed.  History of Present Illness   Alexis Moody is a 48 year old female with hypertension who presents with a recent gout flare in the left foot.  Left Foot Great Toe Gout Flare ?Chronic Gout Urgent Care Visit recently  on 10/21/24 for Left foot toe Articular pain and swelling - Recent gout flare in the left foot, localized near the great toe  - Symptoms included pain, redness, and swelling - now resolved, pain free swelling resolved - Urgent Care visit - Onset occurred on a Saturday, with spontaneous improvement by the following Monday - Did not use prescribed colchicine  during this episode due to symptom resolution - Needs Uric Acid Gout lab level upcoming  CHRONIC HTN: Elevated BP managed by [facility] Has upcoming work up with ECHO and labs - Scheduled for further evaluation including kidney ultrasound and echocardiogram  - Suspects spironolactone  may have contributed to the gout flare Current Meds - Spironolactone  25mg  daily (newly added) and hydrochlorothiazide  25mg  daily   Reports good compliance, took meds today. Tolerating well, w/o complaints. Denies CP, dyspnea, HA, edema, dizziness / lightheadedness  Grave's Disease Followed by Dr Leartis [facility] On Methimazole       11/10/2024    8:49 AM 12/31/2023    9:25 AM 10/01/2023    8:47 AM  Depression screen PHQ 2/9  Decreased Interest 0 2 1  Moody, Depressed, Hopeless 0 0 0  PHQ - 2 Score 0 2 1  Altered sleeping  0 1  Tired, decreased energy  3 2  Change in appetite  2 1  Feeling bad or failure about yourself   0 0  Trouble concentrating  0 0  Moving  slowly or fidgety/restless  0 0  Suicidal thoughts  0 0  PHQ-9 Score  7  5   Difficult doing work/chores  Very difficult Somewhat difficult     Data saved with a previous flowsheet row definition       11/10/2024    8:49 AM 12/31/2023    9:25 AM 10/01/2023    8:47 AM 03/06/2023    4:02 PM  GAD 7 : Generalized Anxiety Score  Nervous, Anxious, on Edge 0 0 0 0  Control/stop worrying 0 0 0 0  Worry too much - different things 0 0 0 0  Trouble relaxing 0 0 0 0  Restless 0 0 0 0  Easily annoyed or irritable 0 0 0 0  Afraid - awful might happen 0 0 0 0  Total GAD 7 Score 0 0 0 0  Anxiety Difficulty    Not difficult at all    Social History[1]  Review of Systems Per HPI unless specifically indicated above     Objective:    BP (!) 152/80 (BP Location: Left Arm, Cuff Size: Normal)   Pulse 88   Ht 5' (1.524 m)   Wt 228 lb (103.4 kg)   LMP 10/18/2024   SpO2 95%   BMI 44.53 kg/m   Wt Readings from Last 3 Encounters:  11/10/24 228 lb (103.4 kg)  10/14/24 229 lb (103.9 kg)  10/08/24 229 lb 14.4  oz (104.3 kg)    Physical Exam Vitals and nursing note reviewed.  Constitutional:      General: She is not in acute distress.    Appearance: Normal appearance. She is well-developed. She is not diaphoretic.     Comments: Well-appearing, comfortable, cooperative  HENT:     Head: Normocephalic and atraumatic.  Eyes:     General:        Right eye: No discharge.        Left eye: No discharge.     Conjunctiva/sclera: Conjunctivae normal.  Cardiovascular:     Rate and Rhythm: Normal rate.  Pulmonary:     Effort: Pulmonary effort is normal.  Skin:    General: Skin is warm and dry.     Findings: No erythema or rash.  Neurological:     Mental Status: She is alert and oriented to person, place, and time.  Psychiatric:        Mood and Affect: Mood normal.        Behavior: Behavior normal.        Thought Content: Thought content normal.     Comments: Well groomed, good eye contact,  normal speech and thoughts     Results for orders placed or performed in visit on 10/08/24  Cortisol   Collection Time: 10/10/24  8:51 AM  Result Value Ref Range   Cortisol 6.1 (L) 6.2 - 19.4 ug/dL  Aldosterone + renin activity w/ ratio   Collection Time: 10/10/24  8:51 AM  Result Value Ref Range   Aldosterone 12.2 0.0 - 30.0 ng/dL   Renin Activity, Plasma 1.167 0.167 - 5.380 ng/mL/hr   Aldos/Renin Ratio 10.5 0.0 - 30.0  Basic Metabolic Panel (BMET)   Collection Time: 10/17/24 10:36 AM  Result Value Ref Range   Glucose 94 70 - 99 mg/dL   BUN 16 6 - 24 mg/dL   Creatinine, Ser 9.09 0.57 - 1.00 mg/dL   eGFR 80 >40 fO/fpw/8.26   BUN/Creatinine Ratio 18 9 - 23   Sodium 139 134 - 144 mmol/L   Potassium 4.0 3.5 - 5.2 mmol/L   Chloride 100 96 - 106 mmol/L   CO2 24 20 - 29 mmol/L   Calcium 9.1 8.7 - 10.2 mg/dL      Assessment & Plan:   Problem List Items Addressed This Visit     Chronic idiopathic gout involving toe of left foot without tophus - Primary   Essential hypertension   Graves disease     Chronic idiopathic gout, left foot Recent flare near left great toe improved without colchicine , 1st gout flare, diagnosed at urgent care, it resolved promptly with treatment. Possible trigger from spironolactone  vs hydrochlorothiazide  Dietary factors may contribute.  - Ordered uric acid level as part of annual blood work. For Feb 2026 - Provided dietary guidance for avoidance on AVS - Educated on colchicine  dosing: take two pills initially, wait an hour, then take a third, and repeat in three days if needed. She has 6 pills from UC, I did not re order, but gave her instructions on dosing. - Scheduled annual physical and blood work in February.  Essential hypertension Followed by [facility] Blood pressure improved with spironolactone . Ongoing management with cardiologist. Upcoming kidney ultrasound and echocardiogram for heart murmur assessment. - Continue  current antihypertensive regimen including spironolactone  and hydrochlorothiazide . - Proceed with scheduled kidney ultrasound and echocardiogram.  Grave's Disease Hyperthyroid followed by Endocrinology [facility] On medication managemet methimazole  General health maintenance Routine health  maintenance discussed. Vitamin D  and K supplementation ongoing. Hepatitis B immunity to be confirmed. Colonoscopy preferred for colorectal cancer screening. - Ordered vitamin D  and B12 levels. - Ordered hepatitis B antibody level. - Scheduled annual physical and blood work in February. - Provided information on low purine diet for gout management. - Discussed colonoscopy as preferred colorectal cancer screening method. She prefers Colonoscopy in future     No orders of the defined types were placed in this encounter.   No orders of the defined types were placed in this encounter.   Follow up plan: Return in about 2 months (around 01/10/2025) for 2 month Annual Physical ([facility] orders in advance).  Printed all [facility] orders given to patient today  Marsa Officer, DO [facility] 11/10/2024, 9:09 AM     [1]  Social History Tobacco Use   Smoking status: Former    Current packs/day: 0.00    Types: Cigarettes    Start date: 11/13/1994    Quit date: 11/14/1999    Years since quitting: 25.0    Passive exposure: Never   Smokeless tobacco: Never  Vaping Use   Vaping status: Former  Substance Use Topics   Alcohol use: Yes    Alcohol/week: 1.0 standard drink of alcohol    Comment: occasional   Drug use: Never   "

## 2024-11-24 ENCOUNTER — Ambulatory Visit

## 2024-11-24 ENCOUNTER — Ambulatory Visit: Attending: Cardiovascular Disease

## 2024-11-24 DIAGNOSIS — R600 Localized edema: Secondary | ICD-10-CM

## 2024-11-24 DIAGNOSIS — I10 Essential (primary) hypertension: Secondary | ICD-10-CM

## 2024-11-24 DIAGNOSIS — R011 Cardiac murmur, unspecified: Secondary | ICD-10-CM

## 2024-11-24 LAB — ECHOCARDIOGRAM COMPLETE
AR max vel: 2.65 cm2
AV Area VTI: 2.84 cm2
AV Area mean vel: 2.55 cm2
AV Mean grad: 4 mmHg
AV Peak grad: 7.2 mmHg
Ao pk vel: 1.34 m/s
Area-P 1/2: 4.36 cm2
S' Lateral: 2.41 cm

## 2024-12-07 ENCOUNTER — Other Ambulatory Visit: Payer: Self-pay | Admitting: Radiology

## 2024-12-07 DIAGNOSIS — D259 Leiomyoma of uterus, unspecified: Secondary | ICD-10-CM

## 2024-12-08 ENCOUNTER — Other Ambulatory Visit (HOSPITAL_COMMUNITY): Payer: Self-pay | Admitting: Student

## 2024-12-09 ENCOUNTER — Encounter: Payer: Self-pay | Admitting: Radiology

## 2024-12-09 ENCOUNTER — Ambulatory Visit
Admission: RE | Admit: 2024-12-09 | Discharge: 2024-12-09 | Disposition: A | Source: Ambulatory Visit | Admitting: Radiology

## 2024-12-09 DIAGNOSIS — I10 Essential (primary) hypertension: Secondary | ICD-10-CM | POA: Insufficient documentation

## 2024-12-09 DIAGNOSIS — Z79899 Other long term (current) drug therapy: Secondary | ICD-10-CM | POA: Diagnosis not present

## 2024-12-09 DIAGNOSIS — Z87891 Personal history of nicotine dependence: Secondary | ICD-10-CM | POA: Insufficient documentation

## 2024-12-09 DIAGNOSIS — N920 Excessive and frequent menstruation with regular cycle: Secondary | ICD-10-CM | POA: Diagnosis not present

## 2024-12-09 DIAGNOSIS — D259 Leiomyoma of uterus, unspecified: Secondary | ICD-10-CM | POA: Diagnosis present

## 2024-12-09 LAB — CBC WITH DIFFERENTIAL/PLATELET
Abs Immature Granulocytes: 0.03 10*3/uL (ref 0.00–0.07)
Basophils Absolute: 0 10*3/uL (ref 0.0–0.1)
Basophils Relative: 0 %
Eosinophils Absolute: 0.3 10*3/uL (ref 0.0–0.5)
Eosinophils Relative: 4 %
HCT: 35.5 % — ABNORMAL LOW (ref 36.0–46.0)
Hemoglobin: 11.1 g/dL — ABNORMAL LOW (ref 12.0–15.0)
Immature Granulocytes: 0 %
Lymphocytes Relative: 35 %
Lymphs Abs: 2.5 10*3/uL (ref 0.7–4.0)
MCH: 24.1 pg — ABNORMAL LOW (ref 26.0–34.0)
MCHC: 31.3 g/dL (ref 30.0–36.0)
MCV: 77.2 fL — ABNORMAL LOW (ref 80.0–100.0)
Monocytes Absolute: 0.7 10*3/uL (ref 0.1–1.0)
Monocytes Relative: 10 %
Neutro Abs: 3.7 10*3/uL (ref 1.7–7.7)
Neutrophils Relative %: 51 %
Platelets: 228 10*3/uL (ref 150–400)
RBC: 4.6 MIL/uL (ref 3.87–5.11)
RDW: 15.8 % — ABNORMAL HIGH (ref 11.5–15.5)
WBC: 7.3 10*3/uL (ref 4.0–10.5)
nRBC: 0 % (ref 0.0–0.2)

## 2024-12-09 LAB — BASIC METABOLIC PANEL WITH GFR
Anion gap: 11 (ref 5–15)
BUN: 12 mg/dL (ref 6–20)
CO2: 23 mmol/L (ref 22–32)
Calcium: 9 mg/dL (ref 8.9–10.3)
Chloride: 103 mmol/L (ref 98–111)
Creatinine, Ser: 0.85 mg/dL (ref 0.44–1.00)
GFR, Estimated: >60 mL/min
Glucose, Bld: 108 mg/dL — ABNORMAL HIGH (ref 70–99)
Potassium: 3.8 mmol/L (ref 3.5–5.1)
Sodium: 137 mmol/L (ref 135–145)

## 2024-12-09 LAB — PROTIME-INR
INR: 1 (ref 0.8–1.2)
Prothrombin Time: 13.7 s (ref 11.4–15.2)

## 2024-12-09 LAB — HCG, QUANTITATIVE, PREGNANCY: hCG, Beta Chain, Quant, S: <1 m[IU]/mL

## 2024-12-09 MED ORDER — PANTOPRAZOLE SODIUM 40 MG IV SOLR
40.0000 mg | Freq: Once | INTRAVENOUS | Status: AC
  Administered 2024-12-09: 40 mg via INTRAVENOUS
  Filled 2024-12-09: qty 10

## 2024-12-09 MED ORDER — FENTANYL CITRATE (PF) 100 MCG/2ML IJ SOLN
INTRAMUSCULAR | Status: AC | PRN
  Administered 2024-12-09: 50 ug via INTRAVENOUS
  Administered 2024-12-09: 25 ug via INTRAVENOUS

## 2024-12-09 MED ORDER — LIDOCAINE HCL 1 % IJ SOLN
INTRAMUSCULAR | Status: AC
  Filled 2024-12-09: qty 20

## 2024-12-09 MED ORDER — FENTANYL CITRATE (PF) 100 MCG/2ML IJ SOLN
INTRAMUSCULAR | Status: AC
  Filled 2024-12-09: qty 2

## 2024-12-09 MED ORDER — FENTANYL CITRATE (PF) 100 MCG/2ML IJ SOLN: 50.0000 ug | Freq: Once | INTRAMUSCULAR | Status: AC

## 2024-12-09 MED ORDER — CEFAZOLIN SODIUM-DEXTROSE 2-4 GM/100ML-% IV SOLN
INTRAVENOUS | Status: AC
  Filled 2024-12-09: qty 100

## 2024-12-09 MED ORDER — IOHEXOL 300 MG/ML  SOLN
150.0000 mL | Freq: Once | INTRAMUSCULAR | Status: AC | PRN
  Administered 2024-12-09: 150 mL via INTRA_ARTERIAL

## 2024-12-09 MED ORDER — SODIUM CHLORIDE 0.9 % IV SOLN
INTRAVENOUS | Status: DC
  Administered 2024-12-09: 250 mL via INTRAVENOUS

## 2024-12-09 MED ORDER — DOCUSATE SODIUM 100 MG PO CAPS: 100.0000 mg | ORAL_CAPSULE | Freq: Two times a day (BID) | ORAL | 0 refills | Status: AC

## 2024-12-09 MED ORDER — NITROGLYCERIN 1 MG/10 ML FOR IR/CATH LAB
200.0000 ug | Freq: Once | INTRA_ARTERIAL | Status: AC
  Administered 2024-12-09: 200 ug via INTRA_ARTERIAL

## 2024-12-09 MED ORDER — FENTANYL CITRATE (PF) 100 MCG/2ML IJ SOLN
INTRAMUSCULAR | Status: AC
  Administered 2024-12-09: 50 ug via INTRAVENOUS
  Filled 2024-12-09: qty 2

## 2024-12-09 MED ORDER — NITROGLYCERIN 1 MG/10 ML FOR IR/CATH LAB
INTRA_ARTERIAL | Status: AC
  Filled 2024-12-09: qty 10

## 2024-12-09 MED ORDER — PROMETHAZINE HCL 12.5 MG PO TABS: 12.5000 mg | ORAL_TABLET | Freq: Four times a day (QID) | ORAL | 0 refills | Status: AC | PRN

## 2024-12-09 MED ORDER — KETOROLAC TROMETHAMINE 30 MG/ML IJ SOLN
INTRAMUSCULAR | Status: AC | PRN
  Administered 2024-12-09: 10 mg via INTRAVENOUS
  Administered 2024-12-09: 20 mg via INTRAVENOUS

## 2024-12-09 MED ORDER — HYDROCODONE-ACETAMINOPHEN 5-325 MG PO TABS
ORAL_TABLET | ORAL | Status: AC
  Filled 2024-12-09: qty 1

## 2024-12-09 MED ORDER — SODIUM CHLORIDE 0.9 % IV SOLN
8.0000 mg | Freq: Once | INTRAVENOUS | Status: AC
  Administered 2024-12-09: 8 mg via INTRAVENOUS
  Filled 2024-12-09: qty 4

## 2024-12-09 MED ORDER — VERAPAMIL HCL 2.5 MG/ML IV SOLN: Freq: Once | INTRAVENOUS | Status: AC

## 2024-12-09 MED ORDER — HYDROCODONE-ACETAMINOPHEN 5-325 MG PO TABS
1.0000 | ORAL_TABLET | ORAL | Status: AC
  Administered 2024-12-09: 1 via ORAL

## 2024-12-09 MED ORDER — OXYCODONE-ACETAMINOPHEN 5-325 MG PO TABS: 1.0000 | ORAL_TABLET | ORAL | 0 refills | Status: AC | PRN

## 2024-12-09 MED ORDER — KETOROLAC TROMETHAMINE 30 MG/ML IJ SOLN
INTRAMUSCULAR | Status: AC
  Filled 2024-12-09: qty 1

## 2024-12-09 MED ORDER — HYDRALAZINE HCL 20 MG/ML IJ SOLN
INTRAMUSCULAR | Status: AC
  Filled 2024-12-09: qty 1

## 2024-12-09 MED ORDER — ACETAMINOPHEN 10 MG/ML IV SOLN
1000.0000 mg | Freq: Once | INTRAVENOUS | Status: AC
  Administered 2024-12-09: 1000 mg via INTRAVENOUS
  Filled 2024-12-09: qty 100

## 2024-12-09 MED ORDER — KETOROLAC TROMETHAMINE 10 MG PO TABS: 10.0000 mg | ORAL_TABLET | Freq: Four times a day (QID) | ORAL | 0 refills | Status: AC

## 2024-12-09 MED ORDER — MIDAZOLAM HCL (PF) 2 MG/2ML IJ SOLN
INTRAMUSCULAR | Status: AC | PRN
  Administered 2024-12-09: 1 mg via INTRAVENOUS

## 2024-12-09 MED ORDER — HEPARIN SODIUM (PORCINE) 1000 UNIT/ML IJ SOLN
INTRAMUSCULAR | Status: AC
  Filled 2024-12-09: qty 10

## 2024-12-09 MED ORDER — MIDAZOLAM HCL 2 MG/2ML IJ SOLN
INTRAMUSCULAR | Status: AC
  Filled 2024-12-09: qty 2

## 2024-12-09 MED ORDER — HYDRALAZINE HCL 20 MG/ML IJ SOLN
INTRAMUSCULAR | Status: AC | PRN
  Administered 2024-12-09 (×2): 10 mg via INTRAVENOUS

## 2024-12-09 MED ORDER — IBUPROFEN 800 MG PO TABS: 800.0000 mg | ORAL_TABLET | Freq: Three times a day (TID) | ORAL | 0 refills | Status: AC

## 2024-12-09 MED ORDER — HEPARIN SODIUM (PORCINE) 1000 UNIT/ML IJ SOLN
5000.0000 [IU] | Freq: Once | INTRAMUSCULAR | Status: AC
  Administered 2024-12-09: 5000 [IU] via INTRA_ARTERIAL

## 2024-12-09 MED ORDER — CEFAZOLIN SODIUM-DEXTROSE 2-4 GM/100ML-% IV SOLN
2.0000 g | Freq: Once | INTRAVENOUS | Status: AC
  Administered 2024-12-09: 2 g via INTRAVENOUS

## 2024-12-09 MED ORDER — DEXAMETHASONE SOD PHOSPHATE PF 10 MG/ML IJ SOLN
10.0000 mg | Freq: Once | INTRAMUSCULAR | Status: AC
  Administered 2024-12-09: 10 mg via INTRAVENOUS
  Filled 2024-12-09: qty 1

## 2024-12-09 MED ORDER — VERAPAMIL HCL 2.5 MG/ML IV SOLN
INTRAVENOUS | Status: AC
  Filled 2024-12-09: qty 2

## 2024-12-09 NOTE — H&P (Signed)
 "     Chief Complaint: Patient was seen in consultation today for symptomatic uterine fibroids.   Referring Physician(s): Dr. Jennefer   Supervising Physician: Jenna Hacker  Patient Status: [facility] - Out-pt  History of Present Illness: Alexis Moody is a 49 y.o. female with a medical history significant for HTN, obesity and menorrhagia secondary to uterine fibroids. She was referred to Interventional Radiology to discuss treatment/management options and she met with Dr. Jenna in consultation 10/14/24. She complained of heavy menstrual flow with clots and a very irregular cycle. MRI pelvis 10/06/24 demonstrated many fibroids.   Dr. Jenna reviewed the pathophysiology of uterine fibroids and he discussed multiple treatment options including Lupron, Surgery and uterine artery embolization. The patient expressed an interest in pursuing a minimally invasive treatment option and they discussed the risks, benefits and procedural expectations of UAE. She was in agreement to proceed.   Past Medical History:  Diagnosis Date   Arthritis    Hyperlipidemia    Hypertension    Obesity    Obesity, Class II, BMI 35-39.9, with comorbidity    Thyroid  disease     Past Surgical History:  Procedure Laterality Date   COSMETIC SURGERY  2001   Breast reduction   REDUCTION MAMMAPLASTY      Allergies: Patient has no known allergies.  Medications: Prior to Admission medications  Medication Sig Start Date End Date Taking? Authorizing Provider  Cholecalciferol (VITAMIN D ) 2000 UNITS CAPS Take by mouth daily.     [provider]  colchicine  0.6 MG tablet Take 2 tablets by mouth now.  Then take 1 tablet by mouth one hour later.  May repeat in 3 days if needed. 10/21/24   Corlis Burnard DEL, NP  ferrous sulfate 324 MG TBEC Take 324 mg by mouth.    [provider]  hydrochlorothiazide  (HYDRODIURIL ) 25 MG tablet Take 1 tablet (25 mg total) by mouth daily. 10/08/24   Raford Riggs, MD   methimazole (TAPAZOLE) 5 MG tablet Take by mouth. 09/05/22   [provider]  spironolactone  (ALDACTONE ) 25 MG tablet Take 1 tablet (25 mg total) by mouth daily. 10/08/24 01/06/25  Raford Riggs, MD     Family History  Problem Relation Age of Onset   Hyperlipidemia Mother    Hypertension Mother    Breast cancer Mother 13   Hyperlipidemia Father    Hypertension Father    Diabetes Father    Hypertension Maternal Uncle    Multiple sclerosis Maternal Grandmother    Hypertension Maternal Grandfather    Leukemia Cousin    Stroke Neg Hx    Heart disease Neg Hx     Social History   Socioeconomic History   Marital status: Married    Spouse name: Not on file   Number of children: Not on file   Years of education: Not on file   Highest education level: Not on file  Occupational History   Not on file  Tobacco Use   Smoking status: Former    Current packs/day: 0.00    Types: Cigarettes    Start date: 11/13/1994    Quit date: 11/14/1999    Years since quitting: 25.0    Passive exposure: Never   Smokeless tobacco: Never  Vaping Use   Vaping status: Former  Substance and Sexual Activity   Alcohol use: Yes    Alcohol/week: 1.0 standard drink of alcohol    Comment: occasional   Drug use: Never   Sexual activity: Yes    Partners: Male  Birth control/protection: None  Other Topics Concern   Not on file  Social History Narrative   Not on file   Social Drivers of Health   Tobacco Use: Medium Risk (12/09/2024)   Patient History    Smoking Tobacco Use: Former    Smokeless Tobacco Use: Never    Passive Exposure: Never  Physicist, Medical Strain: Low Risk (11/10/2024)   Overall Financial Resource Strain (CARDIA)    Difficulty of Paying Living Expenses: Not hard at all  Food Insecurity: No Food Insecurity (11/10/2024)   Epic    Worried About Programme Researcher, Broadcasting/film/video in the Last Year: Never true    Ran Out of Food in the Last Year: Never true  Transportation Needs: No  Transportation Needs (11/10/2024)   Epic    Lack of Transportation (Medical): No    Lack of Transportation (Non-Medical): No  Physical Activity: Inactive (11/10/2024)   Exercise Vital Sign    Days of Exercise per Week: 0 days    Minutes of Exercise per Session: Not on file  Stress: No Stress Concern Present (11/10/2024)   [employer] of Occupational Health - Occupational Stress Questionnaire    Feeling of Stress: Not at all  Social Connections: Unknown (11/10/2024)   Social Connection and Isolation Panel    Frequency of Communication with Friends and Family: More than three times a week    Frequency of Social Gatherings with Friends and Family: Once a week    Attends Religious Services: Patient declined    Database Administrator or Organizations: Patient declined    Attends Engineer, Structural: Not on file    Marital Status: Married  Recent Concern: Social Connections - Moderately Isolated (10/08/2024)   Social Connection and Isolation Panel    Frequency of Communication with Friends and Family: More than three times a week    Frequency of Social Gatherings with Friends and Family: Once a week    Attends Religious Services: Never    Database Administrator or Organizations: No    Attends Banker Meetings: Never    Marital Status: Married  Depression (PHQ2-9): Low Risk (11/10/2024)   Depression (PHQ2-9)    PHQ-2 Score: 0  Alcohol Screen: Low Risk (11/10/2024)   Alcohol Screen    Last Alcohol Screening Score (AUDIT): 1  Housing: Low Risk (11/10/2024)   Epic    Unable to Pay for Housing in the Last Year: No    Number of Times Moved in the Last Year: 0    Homeless in the Last Year: No  Utilities: Not At Risk (10/08/2024)   Epic    Threatened with loss of utilities: No  Health Literacy: Adequate Health Literacy (10/08/2024)   B1300 Health Literacy    Frequency of need for help with medical instructions: Never    Review of Systems: A 12 point ROS  discussed and pertinent positives are indicated in the HPI above.  All other systems are negative.  Review of Systems  Genitourinary:  Positive for menstrual problem and vaginal bleeding.  Musculoskeletal:  Positive for back pain.  All other systems reviewed and are negative.   Vital Signs: BP (!) 177/100   Pulse (!) 129   Temp 98.6 F (37 C) (Temporal)   Resp 12   Ht 5' (1.524 m)   Wt 229 lb (103.9 kg)   LMP 12/09/2024   SpO2 99%   BMI 44.72 kg/m   Physical Exam Constitutional:      General: She  is not in acute distress.    Appearance: She is not ill-appearing.  HENT:     Mouth/Throat:     Mouth: Mucous membranes are moist.     Pharynx: Oropharynx is clear.  Cardiovascular:     Rate and Rhythm: Tachycardia present.  Pulmonary:     Effort: Pulmonary effort is normal.  Abdominal:     Tenderness: There is no abdominal tenderness.  Skin:    General: Skin is warm and dry.  Neurological:     Mental Status: She is alert and oriented to person, place, and time.     Labs:  CBC: Recent Labs    12/24/23 0933 12/09/24 0918  WBC 7.6 7.3  HGB 11.4 11.1*  HCT 36.0 35.5*  PLT 220 228    COAGS: Recent Labs    12/09/24 0918  INR 1.0    BMP: Recent Labs    12/24/23 0933 10/17/24 1036 12/09/24 0918  NA 143 139 137  K 3.7 4.0 3.8  CL 103 100 103  CO2 27 24 23   GLUCOSE 102* 94 108*  BUN 14 16 12   CALCIUM 9.3 9.1 9.0  CREATININE 1.07* 0.90 0.85  GFRNONAA  --   --  >60    LIVER FUNCTION TESTS: Recent Labs    12/24/23 0933  BILITOT 0.3  AST 9  ALT 12  ALKPHOS 70  PROT 6.5  ALBUMIN 4.0    TUMOR MARKERS: No results for input(s): AFPTM, CEA, CA199, CHROMGRNA in the last 8760 hours.  Assessment and Plan:  Symptomatic uterine fibroids: Ronold Salt, 49 year old female, presents today to the [facility] Interventional Radiology department for an image-guided uterine artery embolization.   Risks and benefits of this  procedure were discussed with the patient including, but not limited to bleeding, infection, vascular injury or contrast induced renal failure.  All of the patient's questions were answered, patient is agreeable to proceed.  Consent signed and in chart. She has been NPO.   Thank you for this interesting consult.  I greatly enjoyed meeting Alexis Moody and look forward to participating in their care.  A copy of this report was sent to the requesting provider on this date.  Electronically Signed: Warren Dais, AGACNP-BC 12/09/2024, 10:11 AM   I spent a total of  30 Minutes   in face to face in clinical consultation, greater than 50% of which was counseling/coordinating care for symptomatic uterine fibroids.   "

## 2024-12-09 NOTE — Procedures (Signed)
 Interventional Radiology Procedure Note  Procedure: UAE  Complications: None  Estimated Blood Loss: < 10 mL  Findings: Left radial artery access for bilateral uterine artery embolization with 500-700 and 700-900um beads.  Cordella DELENA Banner, MD

## 2025-01-05 ENCOUNTER — Encounter: Admitting: Family Medicine

## 2025-01-28 ENCOUNTER — Encounter (HOSPITAL_BASED_OUTPATIENT_CLINIC_OR_DEPARTMENT_OTHER): Admitting: Cardiovascular Disease
